# Patient Record
Sex: Female | Born: 1970
Health system: Southern US, Community
[De-identification: ages and names within clinical notes are randomized; demographics above are authoritative.]

## PROBLEM LIST (undated history)

## (undated) DIAGNOSIS — G8929 Other chronic pain: Secondary | ICD-10-CM

## (undated) DIAGNOSIS — Z9289 Personal history of other medical treatment: Secondary | ICD-10-CM

## (undated) DIAGNOSIS — M5432 Sciatica, left side: Secondary | ICD-10-CM

## (undated) DIAGNOSIS — R569 Unspecified convulsions: Secondary | ICD-10-CM

## (undated) DIAGNOSIS — N912 Amenorrhea, unspecified: Principal | ICD-10-CM

## (undated) DIAGNOSIS — R002 Palpitations: Secondary | ICD-10-CM

## (undated) DIAGNOSIS — F329 Major depressive disorder, single episode, unspecified: Secondary | ICD-10-CM

## (undated) DIAGNOSIS — R079 Chest pain, unspecified: Secondary | ICD-10-CM

## (undated) DIAGNOSIS — R519 Headache, unspecified: Secondary | ICD-10-CM

## (undated) DIAGNOSIS — R51 Headache: Secondary | ICD-10-CM

## (undated) DIAGNOSIS — F419 Anxiety disorder, unspecified: Secondary | ICD-10-CM

## (undated) DIAGNOSIS — F32A Depression, unspecified: Secondary | ICD-10-CM

## (undated) HISTORY — PX: DILATION AND CURETTAGE OF UTERUS: SHX78

## (undated) HISTORY — DX: Depression, unspecified: F32.A

## (undated) HISTORY — DX: Amenorrhea, unspecified: N91.2

## (undated) HISTORY — DX: Sciatica, left side: M54.32

## (undated) HISTORY — DX: Anxiety disorder, unspecified: F41.9

## (undated) HISTORY — DX: Major depressive disorder, single episode, unspecified: F32.9

## (undated) HISTORY — DX: Unspecified convulsions: R56.9

## (undated) HISTORY — DX: Headache: R51

## (undated) HISTORY — DX: Headache, unspecified: R51.9

---

## 2008-02-24 ENCOUNTER — Inpatient Hospital Stay (HOSPITAL_COMMUNITY): Admission: AD | Admit: 2008-02-24 | Discharge: 2008-02-24 | Payer: Self-pay | Admitting: Family Medicine

## 2008-05-14 ENCOUNTER — Inpatient Hospital Stay (HOSPITAL_COMMUNITY): Admission: AD | Admit: 2008-05-14 | Discharge: 2008-05-14 | Payer: Self-pay | Admitting: Obstetrics & Gynecology

## 2008-05-14 ENCOUNTER — Ambulatory Visit: Payer: Self-pay | Admitting: Obstetrics and Gynecology

## 2008-05-30 ENCOUNTER — Ambulatory Visit (HOSPITAL_COMMUNITY): Admission: RE | Admit: 2008-05-30 | Discharge: 2008-05-30 | Payer: Self-pay | Admitting: Obstetrics & Gynecology

## 2008-06-27 ENCOUNTER — Ambulatory Visit (HOSPITAL_COMMUNITY): Admission: RE | Admit: 2008-06-27 | Discharge: 2008-06-27 | Payer: Self-pay | Admitting: Obstetrics & Gynecology

## 2008-07-11 ENCOUNTER — Ambulatory Visit (HOSPITAL_COMMUNITY): Admission: RE | Admit: 2008-07-11 | Discharge: 2008-07-11 | Payer: Self-pay | Admitting: Obstetrics & Gynecology

## 2008-10-29 ENCOUNTER — Inpatient Hospital Stay (HOSPITAL_COMMUNITY): Admission: AD | Admit: 2008-10-29 | Discharge: 2008-10-29 | Payer: Self-pay | Admitting: Obstetrics & Gynecology

## 2008-11-14 ENCOUNTER — Inpatient Hospital Stay (HOSPITAL_COMMUNITY): Admission: AD | Admit: 2008-11-14 | Discharge: 2008-11-14 | Payer: Self-pay | Admitting: Obstetrics & Gynecology

## 2008-11-14 ENCOUNTER — Ambulatory Visit: Payer: Self-pay | Admitting: Obstetrics and Gynecology

## 2008-12-12 ENCOUNTER — Ambulatory Visit: Payer: Self-pay | Admitting: Obstetrics and Gynecology

## 2008-12-12 ENCOUNTER — Inpatient Hospital Stay (HOSPITAL_COMMUNITY): Admission: AD | Admit: 2008-12-12 | Discharge: 2008-12-15 | Payer: Self-pay | Admitting: Obstetrics & Gynecology

## 2009-06-04 ENCOUNTER — Emergency Department (HOSPITAL_COMMUNITY): Admission: EM | Admit: 2009-06-04 | Discharge: 2009-06-04 | Payer: Self-pay | Admitting: Family Medicine

## 2010-05-23 LAB — POCT URINALYSIS DIP (DEVICE)
Bilirubin Urine: NEGATIVE
Glucose, UA: NEGATIVE mg/dL
Hgb urine dipstick: NEGATIVE
Ketones, ur: NEGATIVE mg/dL
Nitrite: NEGATIVE
Protein, ur: NEGATIVE mg/dL
Specific Gravity, Urine: 1.015 (ref 1.005–1.030)
Urobilinogen, UA: 2 mg/dL — ABNORMAL HIGH (ref 0.0–1.0)
pH: 7 (ref 5.0–8.0)

## 2010-05-23 LAB — POCT PREGNANCY, URINE: Preg Test, Ur: NEGATIVE

## 2010-06-07 LAB — CBC
HCT: 38.2 % (ref 36.0–46.0)
Hemoglobin: 12.6 g/dL (ref 12.0–15.0)
MCHC: 33 g/dL (ref 30.0–36.0)
MCV: 93.5 fL (ref 78.0–100.0)
Platelets: 146 10*3/uL — ABNORMAL LOW (ref 150–400)
RBC: 4.08 MIL/uL (ref 3.87–5.11)
RDW: 14 % (ref 11.5–15.5)
WBC: 8.7 10*3/uL (ref 4.0–10.5)

## 2010-06-07 LAB — RPR: RPR Ser Ql: NONREACTIVE

## 2010-06-09 LAB — DIFFERENTIAL
Basophils Absolute: 0 10*3/uL (ref 0.0–0.1)
Basophils Relative: 0 % (ref 0–1)
Eosinophils Absolute: 0.1 10*3/uL (ref 0.0–0.7)
Eosinophils Relative: 1 % (ref 0–5)
Lymphocytes Relative: 16 % (ref 12–46)
Lymphs Abs: 1.6 10*3/uL (ref 0.7–4.0)
Monocytes Absolute: 0.9 10*3/uL (ref 0.1–1.0)
Monocytes Relative: 9 % (ref 3–12)
Neutro Abs: 7.3 10*3/uL (ref 1.7–7.7)
Neutrophils Relative %: 74 % (ref 43–77)

## 2010-06-09 LAB — CBC
HCT: 37 % (ref 36.0–46.0)
Hemoglobin: 12.3 g/dL (ref 12.0–15.0)
MCHC: 33.3 g/dL (ref 30.0–36.0)
MCV: 93.3 fL (ref 78.0–100.0)
Platelets: 184 10*3/uL (ref 150–400)
RBC: 3.96 MIL/uL (ref 3.87–5.11)
RDW: 14.1 % (ref 11.5–15.5)
WBC: 10 10*3/uL (ref 4.0–10.5)

## 2010-06-09 LAB — COMPREHENSIVE METABOLIC PANEL
ALT: 60 U/L — ABNORMAL HIGH (ref 0–35)
AST: 45 U/L — ABNORMAL HIGH (ref 0–37)
Albumin: 2.8 g/dL — ABNORMAL LOW (ref 3.5–5.2)
Alkaline Phosphatase: 145 U/L — ABNORMAL HIGH (ref 39–117)
BUN: 8 mg/dL (ref 6–23)
CO2: 23 mEq/L (ref 19–32)
Calcium: 9.4 mg/dL (ref 8.4–10.5)
Chloride: 107 mEq/L (ref 96–112)
Creatinine, Ser: 0.55 mg/dL (ref 0.4–1.2)
GFR calc Af Amer: >60 mL/min (ref >60)
GFR calc non Af Amer: >60 mL/min (ref >60)
Glucose, Bld: 108 mg/dL — ABNORMAL HIGH (ref 70–99)
Potassium: 3.8 mEq/L (ref 3.5–5.1)
Sodium: 136 mEq/L (ref 135–145)
Total Bilirubin: 0.5 mg/dL (ref 0.3–1.2)
Total Protein: 6.4 g/dL (ref 6.0–8.3)

## 2010-06-09 LAB — AMYLASE: Amylase: 105 U/L (ref 27–131)

## 2010-06-14 LAB — URINALYSIS, ROUTINE W REFLEX MICROSCOPIC
Bilirubin Urine: NEGATIVE
Glucose, UA: NEGATIVE mg/dL
Hgb urine dipstick: NEGATIVE
Ketones, ur: NEGATIVE mg/dL
Nitrite: NEGATIVE
Protein, ur: NEGATIVE mg/dL
Specific Gravity, Urine: 1.01 (ref 1.005–1.030)
Urobilinogen, UA: 0.2 mg/dL (ref 0.0–1.0)
pH: 6 (ref 5.0–8.0)

## 2010-06-14 LAB — POCT PREGNANCY, URINE: Preg Test, Ur: POSITIVE

## 2010-09-26 IMAGING — US US OB NUCHAL TRANSLUCENCY 1ST GEST
1 series · 18 of 25 positions shown · non-contrast
Comparison: none

OBSTETRICAL ULTRASOUND:
 This ultrasound was performed in The [HOSPITAL], and the [facility] report will be stored to [REDACTED] PACS.

[Series 1: us ob nuchal translucency 1st gest · 18 of 25 slices shown]
[im 1/25]
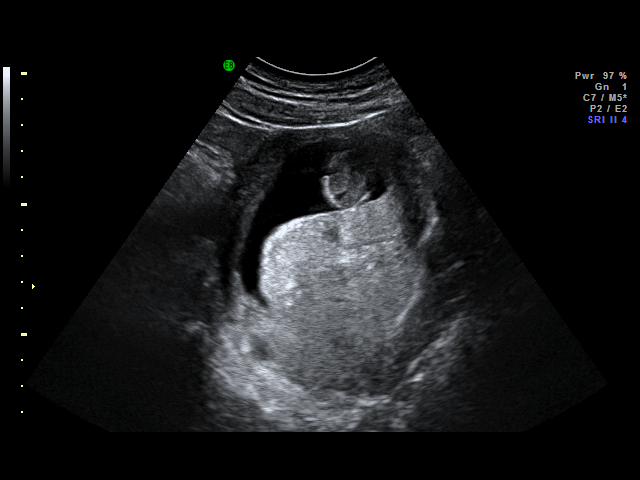
[im 3/25]
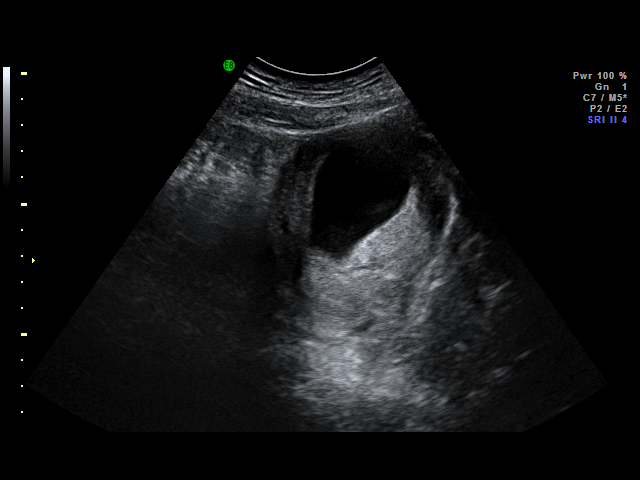
[im 4/25]
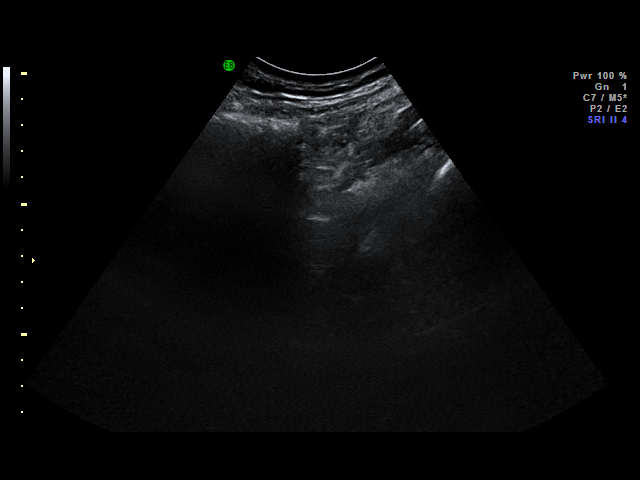
[im 5/25]
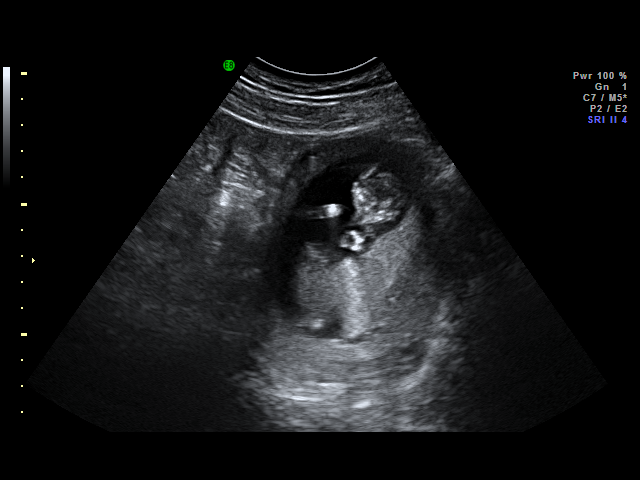
[im 7/25]
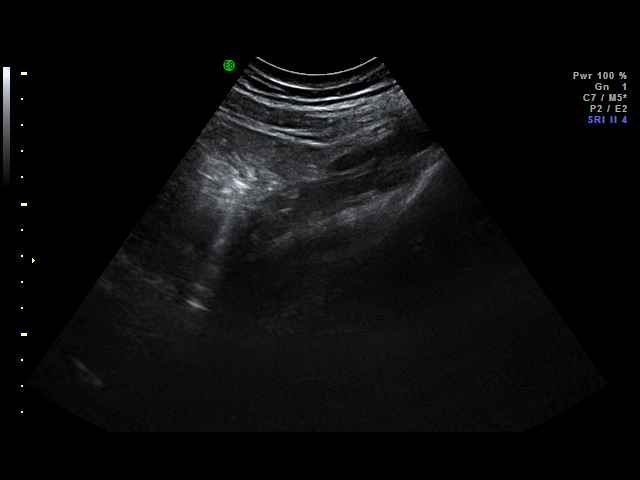
[im 8/25]
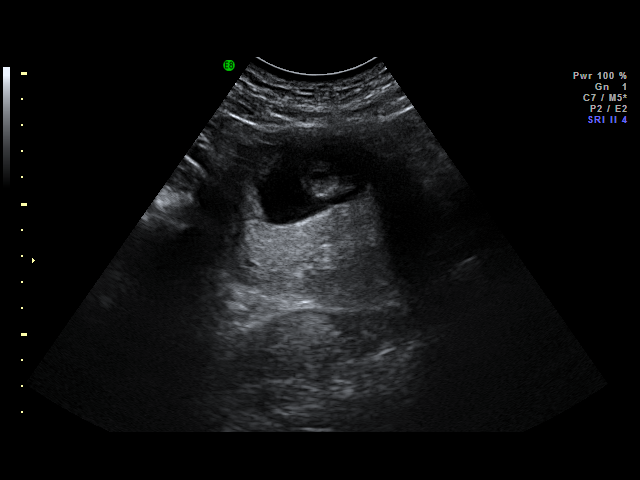
[im 10/25]
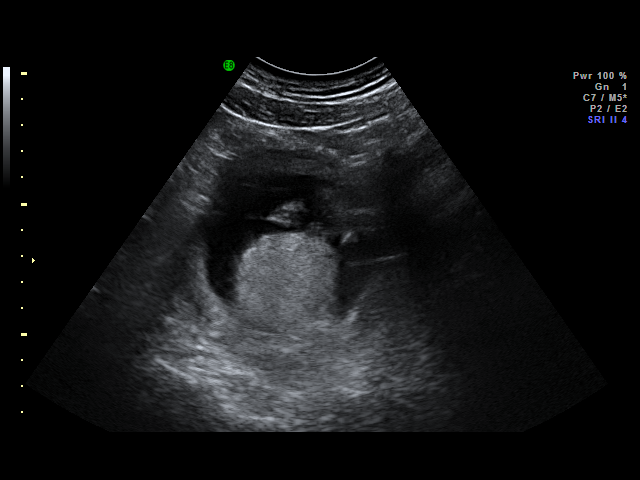
[im 11/25]
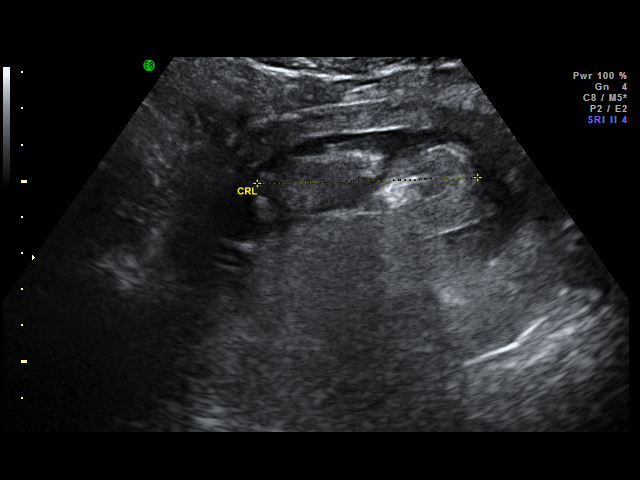
[im 12/25]
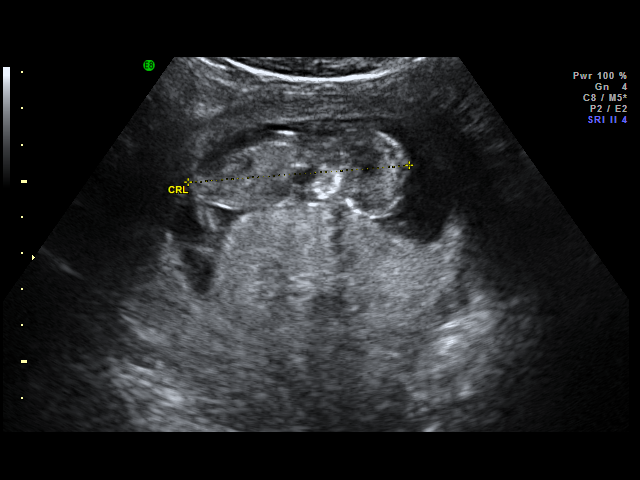
[im 14/25]
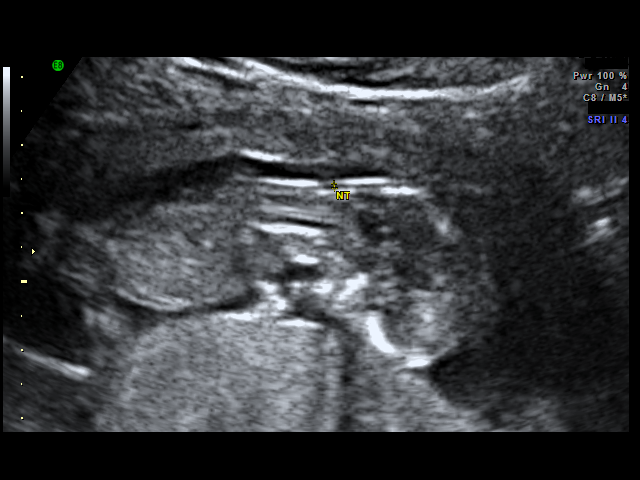
[im 15/25]
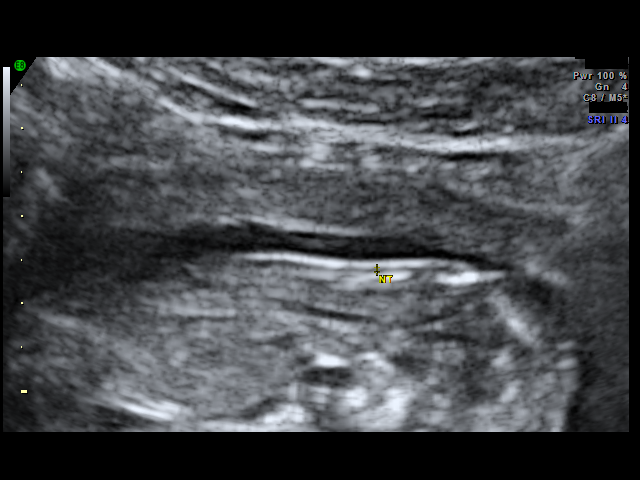
[im 16/25]
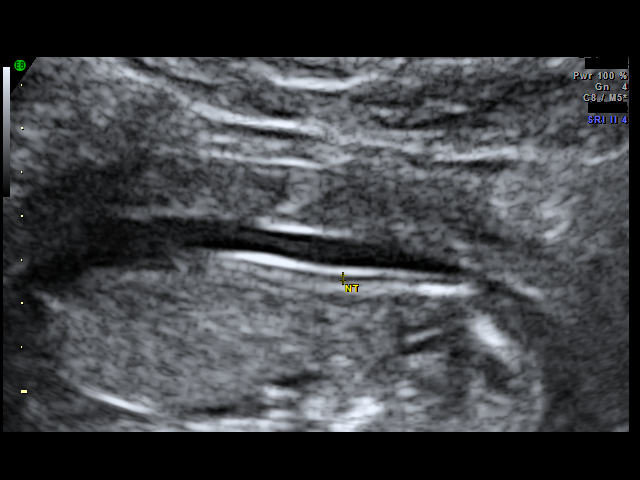
[im 18/25]
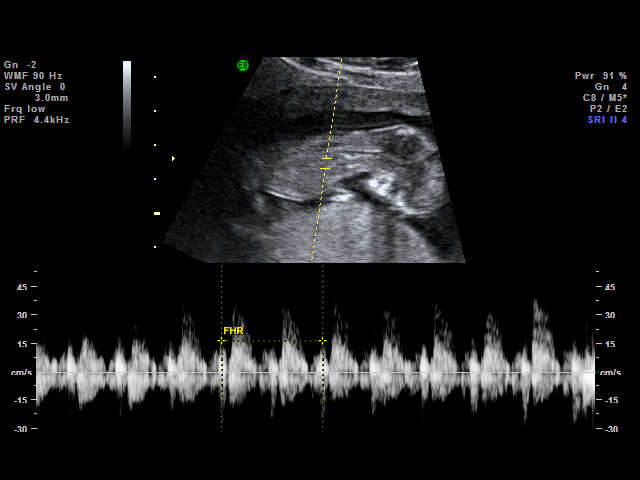
[im 19/25]
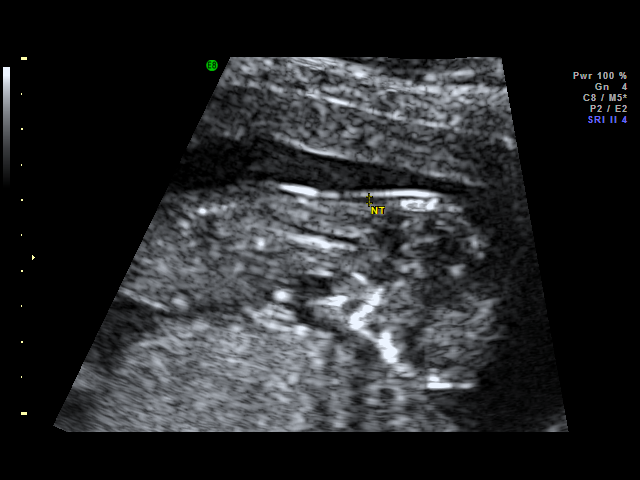
[im 21/25]
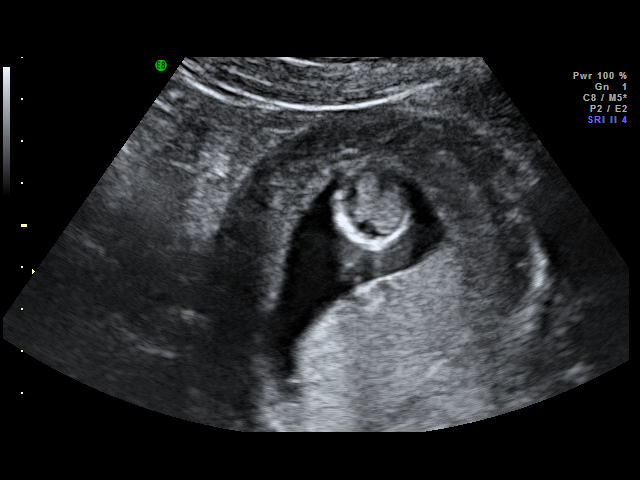
[im 22/25]
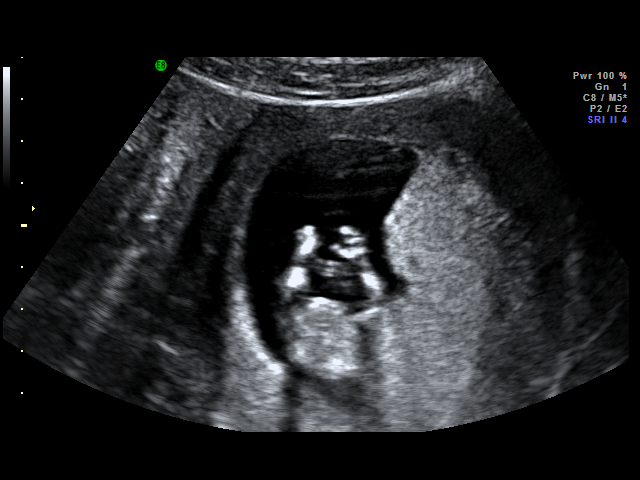
[im 23/25]
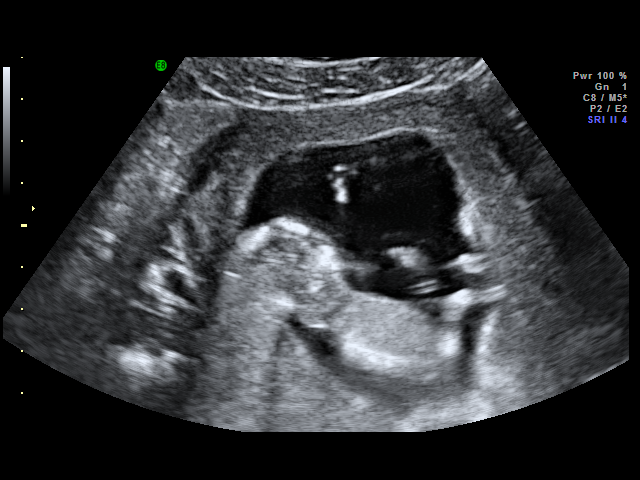
[im 25/25]
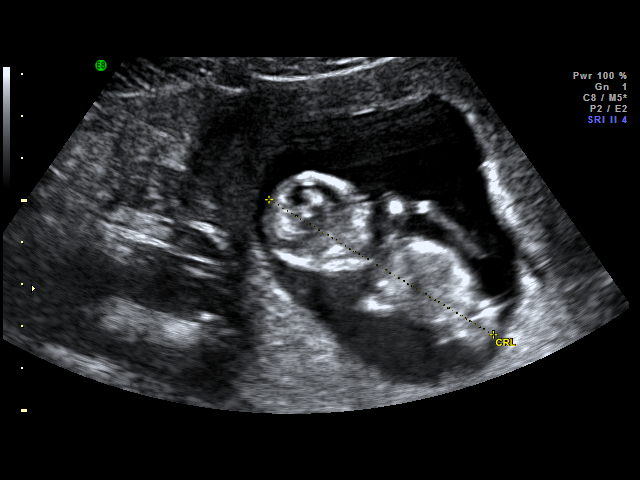

[18 of 25 positions shown; findings below may reference images not displayed]

IMPRESSION: [facility] has also been faxed to the ordering physician.

## 2010-12-07 LAB — URINALYSIS, ROUTINE W REFLEX MICROSCOPIC
Bilirubin Urine: NEGATIVE
Glucose, UA: NEGATIVE mg/dL
Hgb urine dipstick: NEGATIVE
Ketones, ur: NEGATIVE mg/dL
Nitrite: NEGATIVE
Protein, ur: NEGATIVE mg/dL
Specific Gravity, Urine: <1.005 — ABNORMAL LOW (ref 1.005–1.030)
Urobilinogen, UA: 0.2 mg/dL (ref 0.0–1.0)
pH: 6.5 (ref 5.0–8.0)

## 2010-12-07 LAB — POCT PREGNANCY, URINE: Preg Test, Ur: NEGATIVE

## 2010-12-07 LAB — WET PREP, GENITAL
Clue Cells Wet Prep HPF POC: NONE SEEN
Trich, Wet Prep: NONE SEEN
Yeast Wet Prep HPF POC: NONE SEEN

## 2010-12-07 LAB — GC/CHLAMYDIA PROBE AMP, GENITAL
Chlamydia, DNA Probe: NEGATIVE
GC Probe Amp, Genital: NEGATIVE

## 2012-01-14 ENCOUNTER — Encounter (HOSPITAL_COMMUNITY): Payer: Self-pay | Admitting: *Deleted

## 2012-01-14 ENCOUNTER — Emergency Department (HOSPITAL_COMMUNITY): Payer: Self-pay

## 2012-01-14 ENCOUNTER — Emergency Department (HOSPITAL_COMMUNITY)
Admission: EM | Admit: 2012-01-14 | Discharge: 2012-01-14 | Disposition: A | Discharge status: Home / Self Care | Payer: Self-pay | Attending: Emergency Medicine | Admitting: Emergency Medicine

## 2012-01-14 DIAGNOSIS — Z3202 Encounter for pregnancy test, result negative: Secondary | ICD-10-CM | POA: Insufficient documentation

## 2012-01-14 DIAGNOSIS — R0789 Other chest pain: Secondary | ICD-10-CM | POA: Insufficient documentation

## 2012-01-14 LAB — BASIC METABOLIC PANEL
BUN: 16 mg/dL (ref 6–23)
CO2: 29 mEq/L (ref 19–32)
Calcium: 9.5 mg/dL (ref 8.4–10.5)
Chloride: 101 mEq/L (ref 96–112)
Creatinine, Ser: 0.68 mg/dL (ref 0.50–1.10)
GFR calc Af Amer: >90 mL/min (ref >90)
GFR calc non Af Amer: >90 mL/min (ref >90)
Glucose, Bld: 105 mg/dL — ABNORMAL HIGH (ref 70–99)
Potassium: 4.5 mEq/L (ref 3.5–5.1)
Sodium: 138 mEq/L (ref 135–145)

## 2012-01-14 LAB — POCT I-STAT TROPONIN I: Troponin i, poc: 0 ng/mL (ref 0.00–0.08)

## 2012-01-14 LAB — CBC WITH DIFFERENTIAL/PLATELET
Basophils Absolute: 0 10*3/uL (ref 0.0–0.1)
Basophils Relative: 0 % (ref 0–1)
Eosinophils Absolute: 0.1 10*3/uL (ref 0.0–0.7)
Eosinophils Relative: 2 % (ref 0–5)
HCT: 40.1 % (ref 36.0–46.0)
Hemoglobin: 13.4 g/dL (ref 12.0–15.0)
Lymphocytes Relative: 48 % — ABNORMAL HIGH (ref 12–46)
Lymphs Abs: 3.3 10*3/uL (ref 0.7–4.0)
MCH: 30.2 pg (ref 26.0–34.0)
MCHC: 33.4 g/dL (ref 30.0–36.0)
MCV: 90.3 fL (ref 78.0–100.0)
Monocytes Absolute: 0.4 10*3/uL (ref 0.1–1.0)
Monocytes Relative: 6 % (ref 3–12)
Neutro Abs: 3 10*3/uL (ref 1.7–7.7)
Neutrophils Relative %: 44 % (ref 43–77)
Platelets: 211 10*3/uL (ref 150–400)
RBC: 4.44 MIL/uL (ref 3.87–5.11)
RDW: 11.8 % (ref 11.5–15.5)
WBC: 6.9 10*3/uL (ref 4.0–10.5)

## 2012-01-14 LAB — URINALYSIS, ROUTINE W REFLEX MICROSCOPIC
Bilirubin Urine: NEGATIVE
Glucose, UA: NEGATIVE mg/dL
Ketones, ur: NEGATIVE mg/dL
Leukocytes, UA: NEGATIVE
Nitrite: NEGATIVE
Protein, ur: NEGATIVE mg/dL
Specific Gravity, Urine: 1.012 (ref 1.005–1.030)
Urobilinogen, UA: 1 mg/dL (ref 0.0–1.0)
pH: 6.5 (ref 5.0–8.0)

## 2012-01-14 LAB — URINE MICROSCOPIC-ADD ON

## 2012-01-14 LAB — POCT PREGNANCY, URINE: Preg Test, Ur: NEGATIVE

## 2012-01-14 LAB — D-DIMER, QUANTITATIVE: D-Dimer, Quant: 0.6 ug/mL-FEU — ABNORMAL HIGH (ref 0.00–0.48)

## 2012-01-14 MED ORDER — FAMOTIDINE 20 MG PO TABS
20.0000 mg | ORAL_TABLET | Freq: Once | ORAL | Status: AC
  Administered 2012-01-14: 20 mg via ORAL
  Filled 2012-01-14: qty 1

## 2012-01-14 MED ORDER — KETOROLAC TROMETHAMINE 30 MG/ML IJ SOLN
30.0000 mg | Freq: Once | INTRAMUSCULAR | Status: AC
  Administered 2012-01-14: 30 mg via INTRAVENOUS
  Filled 2012-01-14: qty 1

## 2012-01-14 MED ORDER — IOHEXOL 350 MG/ML SOLN
100.0000 mL | Freq: Once | INTRAVENOUS | Status: AC | PRN
  Administered 2012-01-14: 100 mL via INTRAVENOUS

## 2012-01-14 NOTE — ED Provider Notes (Signed)
History     CSN: [id number]  Arrival date & time 01/14/12  4098   First MD Initiated Contact with Patient 01/14/12 2043      Chief Complaint  Patient presents with  . Chest Pain    (Consider location/radiation/quality/duration/timing/severity/associated sxs/prior treatment) HPI Comments: Patient presents with complaint of intermittent midsternal chest pain X 2 months. Patient states that the pain at its worst is 7/10 with some transmission to her back. She states that she saw her physician recently for the same complaint and was told all tests were normal. She sought a second opinion that told her it may be anxiety. Patient states that her mother has cardiomyopathy, but otherwise no family cardiac history. Denies fever or chills. Denies SOB. Denies palpitation or lower extremity edema. Denies cough or congestion. Denies calf pain, history of blood clot, cancer, recent surgery, smoking, exogenous estrogen. She reports a long car ride to [place] 3 months ago.  The history is provided by the patient. No language interpreter was used.    History reviewed. No pertinent past medical history.  History reviewed. No pertinent past surgical history.  No family history on file.  History  Substance Use Topics  . Smoking status: Never Smoker   . Smokeless tobacco: Never Used  . Alcohol Use: No    OB History    Grav Para Term Preterm Abortions TAB SAB Ect Mult Living                  Review of Systems  Constitutional: Negative for fever and chills.  HENT: Negative for congestion.   Respiratory: Negative for cough and shortness of breath.   Cardiovascular: Positive for chest pain. Negative for palpitations and leg swelling.  Gastrointestinal: Negative for nausea.    Allergies  Review of patient's allergies indicates no known allergies.  Home Medications   Current Outpatient Rx  Name  Route  Sig  Dispense  Refill  . ADULT MULTIVITAMIN W/MINERALS CH   Oral   Take 1 tablet by mouth  daily.         Marland Kitchen OVER THE COUNTER MEDICATION   Oral   Take 1 tablet by mouth daily as needed. For stress           BP 109/81  Pulse 60  Temp 98.3 F (36.8 C)  Resp 13  SpO2 100%  LMP 12/03/2011  Physical Exam  Nursing note and vitals reviewed. Constitutional: She appears well-developed and well-nourished.  HENT:  Head: Normocephalic and atraumatic.  Mouth/Throat: Oropharynx is clear and moist.  Eyes: Conjunctivae normal and EOM are normal. No scleral icterus.  Neck: Normal range of motion. Neck supple.  Cardiovascular: Normal rate, regular rhythm, normal heart sounds and intact distal pulses.  Exam reveals no gallop and no friction rub.   No murmur heard. Pulmonary/Chest: Effort normal and breath sounds normal. She exhibits tenderness.       Chest pain is reproducible on exam with palpation of the sternum.   Abdominal: Soft. Bowel sounds are normal. There is no tenderness.  Lymphadenopathy:    She has no cervical adenopathy.  Neurological: She is alert.  Skin: Skin is warm and dry.    ED Course  Procedures (including critical care time)  Labs Reviewed  CBC WITH DIFFERENTIAL - Abnormal; Notable for the following:    Lymphocytes Relative 48 (*)     All other components within normal limits  BASIC METABOLIC PANEL - Abnormal; Notable for the following:    Glucose, Bld 105 (*)  All other components within normal limits  URINALYSIS, ROUTINE W REFLEX MICROSCOPIC - Abnormal; Notable for the following:    Hgb urine dipstick TRACE (*)     All other components within normal limits  D-DIMER, QUANTITATIVE - Abnormal; Notable for the following:    D-Dimer, Quant 0.60 (*)     All other components within normal limits  POCT PREGNANCY, URINE  URINE MICROSCOPIC-ADD ON  POCT I-STAT TROPONIN I   Results for orders placed during the hospital encounter of 01/14/12  CBC WITH DIFFERENTIAL      Component Value Range   WBC 6.9  4.0 - 10.5 K/uL   RBC 4.44  3.87 - 5.11 MIL/uL    Hemoglobin 13.4  12.0 - 15.0 g/dL   HCT 16.1  09.6 - 04.5 %   MCV 90.3  78.0 - 100.0 fL   MCH 30.2  26.0 - 34.0 pg   MCHC 33.4  30.0 - 36.0 g/dL   RDW 40.9  81.1 - 91.4 %   Platelets 211  150 - 400 K/uL   Neutrophils Relative 44  43 - 77 %   Neutro Abs 3.0  1.7 - 7.7 K/uL   Lymphocytes Relative 48 (*) 12 - 46 %   Lymphs Abs 3.3  0.7 - 4.0 K/uL   Monocytes Relative 6  3 - 12 %   Monocytes Absolute 0.4  0.1 - 1.0 K/uL   Eosinophils Relative 2  0 - 5 %   Eosinophils Absolute 0.1  0.0 - 0.7 K/uL   Basophils Relative 0  0 - 1 %   Basophils Absolute 0.0  0.0 - 0.1 K/uL  BASIC METABOLIC PANEL      Component Value Range   Sodium 138  135 - 145 mEq/L   Potassium 4.5  3.5 - 5.1 mEq/L   Chloride 101  96 - 112 mEq/L   CO2 29  19 - 32 mEq/L   Glucose, Bld 105 (*) 70 - 99 mg/dL   BUN 16  6 - 23 mg/dL   Creatinine, Ser 7.82  0.50 - 1.10 mg/dL   Calcium 9.5  8.4 - 95.6 mg/dL   GFR calc non Af Amer >90  >90 mL/min   GFR calc Af Amer >90  >90 mL/min  URINALYSIS, ROUTINE W REFLEX MICROSCOPIC      Component Value Range   Color, Urine YELLOW  YELLOW   APPearance CLEAR  CLEAR   Specific Gravity, Urine 1.012  1.005 - 1.030   pH 6.5  5.0 - 8.0   Glucose, UA NEGATIVE  NEGATIVE mg/dL   Hgb urine dipstick TRACE (*) NEGATIVE   Bilirubin Urine NEGATIVE  NEGATIVE   Ketones, ur NEGATIVE  NEGATIVE mg/dL   Protein, ur NEGATIVE  NEGATIVE mg/dL   Urobilinogen, UA 1.0  0.0 - 1.0 mg/dL   Nitrite NEGATIVE  NEGATIVE   Leukocytes, UA NEGATIVE  NEGATIVE  POCT PREGNANCY, URINE      Component Value Range   Preg Test, Ur NEGATIVE  NEGATIVE  URINE MICROSCOPIC-ADD ON      Component Value Range   Squamous Epithelial / LPF RARE  RARE   RBC / HPF 0-2  <3 RBC/hpf  D-DIMER, QUANTITATIVE      Component Value Range   D-Dimer, Quant 0.60 (*) 0.00 - 0.48 ug/mL-FEU  POCT I-STAT TROPONIN I      Component Value Range   Troponin i, poc 0.00  0.00 - 0.08 ng/mL   Comment 3  Dg Chest 2 View  01/14/2012   *RADIOLOGY REPORT*  Clinical Data:  Chest pain.  CHEST - 2 VIEW  Comparison: None  Findings: The heart size and mediastinal contours are within normal limits.  Both lungs are clear.  The visualized skeletal structures are unremarkable.  IMPRESSION: No active disease.   Original Report Authenticated By: Irish Lack, M.D.    Ct Angio Chest Pe W/cm &/or Wo Cm  01/14/2012  *RADIOLOGY REPORT*  Clinical Data: Elevated D-dimer.  Chest pain.  Shortness of breath.  CT ANGIOGRAPHY CHEST  Technique:  Multidetector CT imaging of the chest using the standard protocol during bolus administration of intravenous contrast. Multiplanar reconstructed images including MIPs were obtained and reviewed to evaluate the vascular anatomy.  Contrast: OMNIPAQUE IOHEXOL 350 MG/ML SOLN  Comparison: 01/14/2012 chest x-ray.  Findings: No filling defects in the pulmonary arteries to suggest pulmonary emboli. Heart is normal size. Aorta is normal caliber. No mediastinal, hilar, or axillary adenopathy.  Visualized thyroid and chest wall soft tissues unremarkable.  Lungs are clear.  No focal airspace opacities or suspicious nodules.  No effusions. Imaging into the upper abdomen shows no acute findings.  IMPRESSION: No acute findings.  No evidence of pulmonary embolus.   Original Report Authenticated By: Charlett Nose, M.D.      1. Chest pain, non-cardiac     Date: 01/15/2012  Rate: 87  Rhythm: normal sinus rhythm  QRS Axis: normal  Intervals: normal  ST/T Wave abnormalities: normal  Conduction Disutrbances:none  Narrative Interpretation: NO STEMI  Old EKG Reviewed: none available     MDM  Patient presented with CP for 2 months. EKG and CXR: unremarkable. Troponin: negative. D-dimer: elevated. CT angio: unremarkable. Patient pain improved with Toradol. Low likelihood of cardiac or pulmonary etiology. Patient discharged with return precautions. Patient encouraged to follow-up with PCP for anxiety recommendations. No red  flags for AMI, PE, PNA, or DVT.        Pixie Casino, PA-C 01/15/12 0017

## 2012-01-14 NOTE — ED Notes (Signed)
Pt is here with midsternal chest pain for the last 2 months and increases with deep breath.  Three months ago drove to [place].  SOB.

## 2012-01-14 NOTE — ED Notes (Signed)
Patient transported to and from radiology. 

## 2012-01-15 NOTE — ED Provider Notes (Signed)
Medical screening examination/treatment/procedure(s) were performed by non-physician practitioner and as supervising physician I was immediately available for consultation/collaboration.  Leona Pressly K Linker, MD 01/15/12 1706 

## 2012-06-26 ENCOUNTER — Ambulatory Visit: Payer: 59 | Admitting: Emergency Medicine

## 2012-06-26 VITALS — BP 120/82 | HR 72 | Temp 98.0°F | Resp 18 | Ht 69.5 in | Wt 155.0 lb

## 2012-06-26 DIAGNOSIS — L040 Acute lymphadenitis of face, head and neck: Secondary | ICD-10-CM

## 2012-06-26 DIAGNOSIS — L049 Acute lymphadenitis, unspecified: Secondary | ICD-10-CM

## 2012-06-26 MED ORDER — PENICILLIN V POTASSIUM 500 MG PO TABS: 500.0000 mg | ORAL_TABLET | Freq: Four times a day (QID) | ORAL | Status: DC

## 2012-06-26 NOTE — Progress Notes (Signed)
[facility] [address] [place] [address] [phone]  Date:  06/26/2012   Name:  Katie Chaney   DOB:  27-Aug-1970   MRN:  [id number]  PCP:  No PCP Per Patient    Chief Complaint: Sore Throat   History of Present Illness:  Katie Chaney is a 42 y.o. very pleasant female patient who presents with the following:  Sore throat for three days.  Located on right side and in nodes in right neck.  No fever or chills. No coryza or cough.  Some dysphagia.  No nausea or vomiting.  No stool change or rash.  Has history of recurrent visits for palpitations and evaluated in ER for chest pain.  Still having palpitations and requests a visit for evaluation with cardiology.  There is no problem list on file for this patient.   Past Medical History  Diagnosis Date  . Anxiety   . Seizures     No past surgical history on file.  History  Substance Use Topics  . Smoking status: Never Smoker   . Smokeless tobacco: Never Used  . Alcohol Use: Yes    Family History  Problem Relation Age of Onset  . Heart disease Mother   . Heart disease Brother   . Asthma Son     No Known Allergies  Medication list has been reviewed and updated.  Current Outpatient Prescriptions on File Prior to Visit  Medication Sig Dispense Refill  . Multiple Vitamin (MULTIVITAMIN WITH MINERALS) TABS Take 1 tablet by mouth daily.      Marland Kitchen OVER THE COUNTER MEDICATION Take 1 tablet by mouth daily as needed. For stress       No current facility-administered medications on file prior to visit.    Review of Systems:  As per HPI, otherwise negative.    Physical Examination: Filed Vitals:   06/26/12 1231  BP: 120/82  Pulse: 72  Temp: 98 F (36.7 C)  Resp: 18   Filed Vitals:   06/26/12 1231  Height: 5' 9.5" (1.765 m)  Weight: 155 lb (70.308 kg)   Body mass index is 22.57 kg/(m^2). Ideal Body Weight: Weight in (lb) to have BMI = 25: 171.4  GEN: WDWN, NAD, Non-toxic, A & O x  3 HEENT: Atraumatic, Normocephalic. Neck supple. No masses, right cervical lymphadenopathy.   Ears and Nose: No external deformity. CV: RRR, No M/G/R. No JVD. No thrill. No extra heart sounds. PULM: CTA B, no wheezes, crackles, rhonchi. No retractions. No resp. distress. No accessory muscle use. ABD: S, NT, ND, +BS. No rebound. No HSM. EXTR: No c/c/e NEURO Normal gait.  PSYCH: Normally interactive. Conversant. Not depressed or anxious appearing.  Calm demeanor.    Assessment and Plan: Cervical lymphadenitis Pen vk Palpitations and chest pain Cardiology consultation   Signed,  Phillips Odor, MD

## 2012-06-26 NOTE — Progress Notes (Signed)
Reviewed and agree.

## 2012-06-26 NOTE — Patient Instructions (Addendum)
Linfadenopatía °(Lymphadenopathy) °Linfadenopatía significa "enfermedad de los ganglios linfáticos". Pero el término se utiliza para describir la hinchazón o dilatación de las glándulas linfáticas, también llamadas ganglios linfáticos. Son órganos con forma de frijol que se encuentran en muchos lugares, entre ellos el cuello, las axilas y la ingle. Las glándulas linfáticas son parte del sistema inmunológico, que lucha contra las infecciones en su cuerpo. La linfadenopatía puede ocurrir sólo en una zona de su cuerpo, como el cuello, o puede estar generalizada y presentar un agrandamiento de los ganglios en varios lugares. Los ganglios del cuello son los que más comúnmente presentan el trastorno. °CAUSAS  °Cuando su sistema inmunológico responde a los gérmenes (como virus o bacterias), se producen células y líquidos para luchar contra la infección. Esto hace que las glándulas aumenten su tamaño. Esto por lo general es normal y no debe preocuparse. En ocasiones, las mismas glándulas pueden infectarse e hincharse. Esto se denomina linfadenitis. °El agrandamiento de los ganglios linfáticos puede estar causado por muchas enfermedades. °· Enfermedades bacterianas, como faringitis estreptocócica o una infección de la piel. °· Enfermedades virales, como el resfrío común. °· Otros gérmenes, como la enfermedad de Lyme, tuberculosis, o enfermedades de transmisión sexual. °· Ciertos tipos de cáncer, como linfoma (cáncer del sistema linfático) o leucemia (cáncer de los glóbulos blancos). °· Enfermedades inflamatorias como el lupus o la artritis reumatoide. °· Reacciones alérgicas a medicamentos. °Muchas de las enfermedades mencionadas arriba son poco frecuentes, pero importantes. Es por esto que debe ver al profesional que lo asiste si tiene linfadenopatía. °SÍNTOMAS °· Bultos inflamados en el cuello, la parte posterior de la cabeza u otros lugares. °· Sensibilidad. °· Calor o enrojecimiento de la piel sobre los ganglios  linfáticos. °· Fiebre. °DIAGNÓSTICO °A menudo el agrandamiento de los ganglios linfáticos ocurre cerca del origen de la infección. Esto puede ayudar a los médicos a diagnosticar su enfermedad. Para ejemplo:  °· La presencia de ganglios linfáticos inflamados cerca de la mandíbula puede ser resultado de una infección en la boca. °· Ganglios inflamados en el cuello son a menudo una señal de una infección en la garganta. °· Si existen ganglios inflamados en más de una zona es probable que exista una enfermedad causada por un virus. °El profesional muy probablemente sabrá qué es lo que causa su linfadenopatía luego de conocer su historial y de examinarlo. Pueden ser necesarios análisis de sangre, radiografías y otras pruebas. Si no se puede encontrar la causa de la inflamación de los ganglios, y ésta no se va por sí misma, entonces puede ser necesaria una biopsia. El profesional lo comentará con usted. °TRATAMIENTO °El tratamiento dependerá de la causa que provoca la inflamación. Muchas veces los ganglios volverán a su tamaño normal, sin necesidad de un tratamiento. Es posible que deba utilizar antibióticos u otros medicamentos para tratar una infección. Sólo tome medicamentos de venta libre o los que le prescriba su médico para aliviar el dolor, el malestar o la fiebre, según las indicaciones. °INSTRUCCIONES PARA EL CUIDADO DOMICILIARIO °Las glándulas linfáticas inflamadas volverán a su tamaño normal cuando el trastorno subyacente que causa la inflamación se haya curado. Si la inflamación persiste, consulte con el profesional que lo asiste. Es posible que le prescriba antibióticos u otro tratamiento, esto depende del diagnóstico. Utilice los medicamentos tal como se le indicó. Vaya a las citas de seguimiento que se hayan programado para controlar el estado de sus ganglios.  °SOLICITE ATENCIÓN MÉDICA SI: °· La inflamación dura más de dos semanas. °· Tiene síntomas como pérdida de   peso, transpira por la noche, fatiga o  fiebre persistente.  Los ganglios se sienten duros, parecen fijos en la piel o crecen con rapidez.  La piel sobre los ganglios linfticos se ve roja e inflamada. Esto puede ser indicio de una infeccin. SOLICITE ATENCIN MDICA DE INMEDIATO SI:  Comienza a filtrarse lquido de la zona del ganglio inflamado.  La temperatura se eleva por encima de 102 F (38.9 C).  Siente dolor fuerte (no necesariamente en la zona del ganglio inflamado).  Siente falta de aire o Journalist, newspaper.  Presenta dolor abdominal que empeora. ASEGRESE DE QUE:   Comprende estas instrucciones.  Controlar el trastorno.  Pedir ayuda enseguida si no se recupera adecuadamente o si empeora. Document Released: 05/17/2008 Document Revised: 05/13/2011 [facility] Patient Information 2013 [place], [place].

## 2012-07-02 DIAGNOSIS — Z9289 Personal history of other medical treatment: Secondary | ICD-10-CM

## 2012-07-02 HISTORY — DX: Personal history of other medical treatment: Z92.89

## 2012-07-22 ENCOUNTER — Encounter: Payer: Self-pay | Admitting: Cardiology

## 2012-07-22 ENCOUNTER — Encounter: Payer: Self-pay | Admitting: *Deleted

## 2012-07-22 DIAGNOSIS — R569 Unspecified convulsions: Secondary | ICD-10-CM | POA: Insufficient documentation

## 2012-07-22 DIAGNOSIS — F419 Anxiety disorder, unspecified: Secondary | ICD-10-CM | POA: Insufficient documentation

## 2012-07-22 DIAGNOSIS — F418 Other specified anxiety disorders: Secondary | ICD-10-CM | POA: Insufficient documentation

## 2012-07-22 DIAGNOSIS — R079 Chest pain, unspecified: Secondary | ICD-10-CM | POA: Insufficient documentation

## 2012-07-23 ENCOUNTER — Ambulatory Visit (INDEPENDENT_AMBULATORY_CARE_PROVIDER_SITE_OTHER): Payer: 59 | Admitting: Cardiology

## 2012-07-23 ENCOUNTER — Encounter: Payer: Self-pay | Admitting: Cardiology

## 2012-07-23 VITALS — BP 116/72 | HR 69 | Ht 69.5 in | Wt 155.0 lb

## 2012-07-23 DIAGNOSIS — R002 Palpitations: Secondary | ICD-10-CM

## 2012-07-23 DIAGNOSIS — R079 Chest pain, unspecified: Secondary | ICD-10-CM

## 2012-07-23 NOTE — Assessment & Plan Note (Signed)
Will arrange a CardioNet to further evaluate.

## 2012-07-23 NOTE — Assessment & Plan Note (Addendum)
Symptoms atypical and may be musculoskeletal. Given dyspnea will schedule echocardiogram to assess LV function.

## 2012-07-23 NOTE — Progress Notes (Signed)
  HPI: 42 year old female for evaluation of chest pain and palpitations. Patient states she has had intermittent palpitations for approximately one and one half years. They are described as a skip and a flutter. They occur 3 times weekly. No syncope. She has dyspnea on exertion but no orthopnea, PND or pedal edema. She has occasional pain radiating to her left neck and chest. It can last all day and is relieved with aspirin. It increases with inspiration. Because of the above cardiology asked to evaluate.  Current Outpatient Prescriptions  Medication Sig Dispense Refill  . Multiple Vitamin (MULTIVITAMIN WITH MINERALS) TABS Take 1 tablet by mouth daily.      Marland Kitchen OVER THE COUNTER MEDICATION Take 1 tablet by mouth daily as needed. For stress (one a day women's for stress)       No current facility-administered medications for this visit.    No Known Allergies  Past Medical History  Diagnosis Date  . Anxiety   . Seizures     History reviewed. No pertinent past surgical history.  History   Social History  . Marital Status: Married    Spouse Name: N/A    Number of Children: 1  . Years of Education: N/A   Occupational History  .     Social History Main Topics  . Smoking status: Never Smoker   . Smokeless tobacco: Never Used  . Alcohol Use: Yes     Comment: Rarely  . Drug Use: No  . Sexually Active: Not on file   Other Topics Concern  . Not on file   Social History Narrative  . No narrative on file    Family History  Problem Relation Age of Onset  . Heart disease Mother     Enlarged heart  . Hypertension Brother   . Asthma Son     ROS: no fevers or chills, productive cough, hemoptysis, dysphasia, odynophagia, melena, hematochezia, dysuria, hematuria, rash, seizure activity, orthopnea, PND, pedal edema, claudication. Remaining systems are negative.  Physical Exam:   Blood pressure 116/72, pulse 69, height 5' 9.5" (1.765 m), weight 155 lb (70.308 kg), last menstrual  period 06/27/2011.  General:  Well developed/well nourished in NAD Skin warm/dry Patient not depressed No peripheral clubbing Back-normal HEENT-normal/normal eyelids Neck supple/normal carotid upstroke bilaterally; no bruits; no JVD; no thyromegaly chest - CTA/ normal expansion CV - RRR/normal S1 and S2; no murmurs, rubs or gallops;  PMI nondisplaced Abdomen -NT/ND, no HSM, no mass, + bowel sounds, no bruit 2+ femoral pulses, no bruits Ext-no edema, chords, 2+ DP Neuro-grossly nonfocal  ECG sinus rhythm at a rate of 69. No ST changes. No delta wave.

## 2012-07-23 NOTE — Patient Instructions (Addendum)
Your physician recommends that you schedule a follow-up appointment in: 8 WEEKS WITH DR Jens Som  Your physician has recommended that you wear an event monitor. Event monitors are medical devices that record the heart's electrical activity. Doctors most often [place] these monitors to diagnose arrhythmias. Arrhythmias are problems with the speed or rhythm of the heartbeat. The monitor is a small, portable device. You can wear one while you do your normal daily activities. This is usually used to diagnose what is causing palpitations/syncope (passing out).   Your physician has requested that you have an echocardiogram. Echocardiography is a painless test that uses sound waves to create images of your heart. It provides your doctor with information about the size and shape of your heart and how well your heart's chambers and valves are working. This procedure takes approximately one hour. There are no restrictions for this procedure.

## 2012-07-30 ENCOUNTER — Encounter: Payer: Self-pay | Admitting: *Deleted

## 2012-07-31 ENCOUNTER — Telehealth: Payer: Self-pay | Admitting: Cardiology

## 2012-07-31 ENCOUNTER — Encounter: Payer: Self-pay | Admitting: *Deleted

## 2012-07-31 ENCOUNTER — Ambulatory Visit (HOSPITAL_COMMUNITY): Payer: 59 | Attending: Cardiology | Admitting: Radiology

## 2012-07-31 ENCOUNTER — Encounter (INDEPENDENT_AMBULATORY_CARE_PROVIDER_SITE_OTHER): Payer: 59

## 2012-07-31 DIAGNOSIS — R002 Palpitations: Secondary | ICD-10-CM

## 2012-07-31 DIAGNOSIS — R0989 Other specified symptoms and signs involving the circulatory and respiratory systems: Secondary | ICD-10-CM | POA: Insufficient documentation

## 2012-07-31 DIAGNOSIS — R0609 Other forms of dyspnea: Secondary | ICD-10-CM | POA: Insufficient documentation

## 2012-07-31 DIAGNOSIS — R079 Chest pain, unspecified: Secondary | ICD-10-CM | POA: Insufficient documentation

## 2012-07-31 DIAGNOSIS — R072 Precordial pain: Secondary | ICD-10-CM

## 2012-07-31 NOTE — Progress Notes (Signed)
Echocardiogram performed.  

## 2012-07-31 NOTE — Telephone Encounter (Signed)
New problem ° ° °Per pt returning your call °

## 2012-07-31 NOTE — Progress Notes (Signed)
Patient ID: Katie Chaney, female   DOB: [birth year], 42 y.o.   MRN: [id number] [employer] 30 day event monitor applied to patient.

## 2012-07-31 NOTE — Telephone Encounter (Signed)
pt aware of results of echo

## 2012-08-03 ENCOUNTER — Encounter: Payer: Self-pay | Admitting: *Deleted

## 2012-08-04 ENCOUNTER — Encounter: Payer: 59 | Admitting: Adult Health

## 2012-08-10 ENCOUNTER — Ambulatory Visit (INDEPENDENT_AMBULATORY_CARE_PROVIDER_SITE_OTHER): Payer: 59 | Admitting: Adult Health

## 2012-08-10 ENCOUNTER — Encounter: Payer: Self-pay | Admitting: Adult Health

## 2012-08-10 VITALS — BP 112/70 | Ht 68.0 in | Wt 159.0 lb

## 2012-08-10 DIAGNOSIS — Z3202 Encounter for pregnancy test, result negative: Secondary | ICD-10-CM

## 2012-08-10 DIAGNOSIS — N912 Amenorrhea, unspecified: Secondary | ICD-10-CM

## 2012-08-10 HISTORY — DX: Amenorrhea, unspecified: N91.2

## 2012-08-10 LAB — POCT URINE PREGNANCY: Preg Test, Ur: NEGATIVE

## 2012-08-10 NOTE — Patient Instructions (Addendum)
Follow-up in 2 weeks

## 2012-08-10 NOTE — Progress Notes (Signed)
Subjective:     Patient ID: Katie Chaney, female   DOB: [birth year], 42 y.o.   MRN: [id number]  HPI Katie Chaney is a 42 year old black female in complaining of no menses in 1 year, she was seen at clinic.  Review of Systems Patient denies any headaches, blurred vision, shortness of breath, chest pain, abdominal pain, problems with bowel movements, urination, or intercourse. No joint swelling or mood swings, she does have hot flashes, and would like 1 more child, she has 41 41 year old.NO menses x 1 year.  Reviewed past medical,surgical, social and family history. Reviewed medications and allergies.     Objective:   Physical Exam BP 112/70  Ht 5\' 8"  (1.727 m)  Wt 159 lb (72.122 kg)  BMI 24.18 kg/m2  LMP 06/09/2013Urine pregnancy test negative.Abd: soft non tender, No HSM     Assessment:      Amenorrhea    Plan:    Check TSH, and FSH  Return in 2 weeks to discuss options Call in am for test results, will try to get notes from clinic

## 2012-08-11 LAB — TSH: TSH: 1.274 u[IU]/mL (ref 0.350–4.500)

## 2012-08-11 LAB — FOLLICLE STIMULATING HORMONE: FSH: 134.6 m[IU]/mL — ABNORMAL HIGH

## 2012-08-13 ENCOUNTER — Telehealth: Payer: Self-pay | Admitting: Adult Health

## 2012-08-13 NOTE — Telephone Encounter (Signed)
Pt aware of labs keep follow up appt.

## 2012-08-25 ENCOUNTER — Encounter: Payer: Self-pay | Admitting: *Deleted

## 2012-08-25 ENCOUNTER — Ambulatory Visit: Payer: 59 | Admitting: Adult Health

## 2012-09-01 DIAGNOSIS — R002 Palpitations: Secondary | ICD-10-CM

## 2012-09-01 HISTORY — DX: Palpitations: R00.2

## 2012-09-03 ENCOUNTER — Telehealth: Payer: Self-pay | Admitting: *Deleted

## 2012-09-03 NOTE — Telephone Encounter (Signed)
Spoke with pt, aware monitor reviewed by dr Jens Som shows sinus rhythm with rare PAC.

## 2012-09-25 ENCOUNTER — Telehealth: Payer: Self-pay | Admitting: Adult Health

## 2012-09-25 NOTE — Telephone Encounter (Signed)
I called pt in follow up, she missed 6/24 appt and had not rescheduled, so after my call she wants to make appt for 10/09/12.She now has appt at 12:30 pm with Dr Despina Hidden, to discuss labs and ?premature ovarian failure and treatment options.

## 2012-09-28 ENCOUNTER — Ambulatory Visit: Payer: 59 | Admitting: Cardiology

## 2012-10-09 ENCOUNTER — Ambulatory Visit (INDEPENDENT_AMBULATORY_CARE_PROVIDER_SITE_OTHER): Payer: 59 | Admitting: Obstetrics & Gynecology

## 2012-10-09 ENCOUNTER — Encounter: Payer: Self-pay | Admitting: Obstetrics & Gynecology

## 2012-10-09 VITALS — BP 100/60 | Ht 68.0 in | Wt 162.0 lb

## 2012-10-09 DIAGNOSIS — E28319 Asymptomatic premature menopause: Secondary | ICD-10-CM | POA: Insufficient documentation

## 2012-10-09 MED ORDER — NORETHIN ACE-ETH ESTRAD-FE 1-20 MG-MCG(24) PO TABS: 1.0000 | ORAL_TABLET | Freq: Every day | ORAL | Status: DC

## 2012-10-09 NOTE — Progress Notes (Signed)
Patient ID: Katie Chaney, female   DOB: 06/25/1970, 42 y.o.   MRN: [id number] Charlese is in for evaluation for early menopause(premature ovarian failure is reserve for women under the age of 41 years old) She has not had a period in over one year and her [facility] is certainly in the menopausal range However she understands that this doesn't always main forever and ever on an because the ovaries can sputter for quite some time  However to address the issues of estrogenic deprivation we have decided to place her on a low-dose birth control pill which will also serve the purpose as birth control  All placed her on low estrogen and see her back in 3 months to see how she's doing

## 2012-10-09 NOTE — Patient Instructions (Signed)

## 2012-10-14 ENCOUNTER — Telehealth: Payer: Self-pay | Admitting: Obstetrics & Gynecology

## 2012-10-14 NOTE — Telephone Encounter (Signed)
Spoke with pt. Dr. Despina Hidden put her on birth control pills. She hasn't started med because she was reading side effects and it said it can cause depression, anxiety, and seizures. Pt already has all these. Can you prescribe something else? Thanks!!!

## 2012-10-14 NOTE — Telephone Encounter (Signed)
Spoke with pt letting her know she is on a low dose OCP and that is what the dr wants her to take. Pt voiced understanding. JSY

## 2012-12-01 ENCOUNTER — Encounter: Payer: 59 | Admitting: Cardiology

## 2012-12-01 NOTE — Progress Notes (Signed)
   HPI: fu palpitations. Echocardiogram in May of 2014 showed normal LV function. CardioNet in May of 2014 showed sinus rhythm with occasional PAC. TSH in June of 2014 1.274. Since she was last seen    Current Outpatient Prescriptions  Medication Sig Dispense Refill  . amoxicillin (AMOXIL) 500 MG capsule Take 500 mg by mouth 3 (three) times daily.      . Multiple Vitamin (MULTIVITAMIN WITH MINERALS) TABS Take 1 tablet by mouth daily.      . Norethindrone Acetate-Ethinyl Estrad-FE (LOESTRIN 24 FE) 1-20 MG-MCG(24) tablet Take 1 tablet by mouth daily.  1 Package  11  . OVER THE COUNTER MEDICATION Take 1 tablet by mouth daily as needed. For stress (one a day women's for stress)       No current facility-administered medications for this visit.     Past Medical History  Diagnosis Date  . Anxiety   . Seizures   . Headache(784.0)   . Depression   . Amenorrhea 08/10/2012    Past Surgical History  Procedure Laterality Date  . Dilation and curettage of uterus      History   Social History  . Marital Status: Married    Spouse Name: N/A    Number of Children: 1  . Years of Education: N/A   Occupational History  .     Social History Main Topics  . Smoking status: Never Smoker   . Smokeless tobacco: Never Used  . Alcohol Use: Yes     Comment: Rarely  . Drug Use: No  . Sexual Activity: Yes    Birth Control/ Protection: None   Other Topics Concern  . Not on file   Social History Narrative  . No narrative on file    ROS: no fevers or chills, productive cough, hemoptysis, dysphasia, odynophagia, melena, hematochezia, dysuria, hematuria, rash, seizure activity, orthopnea, PND, pedal edema, claudication. Remaining systems are negative.  Physical Exam: Well-developed well-nourished in no acute distress.  Skin is warm and dry.  HEENT is normal.  Neck is supple.  Chest is clear to auscultation with normal expansion.  Cardiovascular exam is regular rate and rhythm.  Abdominal  exam nontender or distended. No masses palpated. Extremities show no edema. neuro grossly intact  ECG     This encounter was created in error - please disregard.

## 2013-01-11 ENCOUNTER — Ambulatory Visit: Payer: 59 | Admitting: Obstetrics & Gynecology

## 2013-01-11 ENCOUNTER — Encounter: Payer: Self-pay | Admitting: *Deleted

## 2013-08-02 ENCOUNTER — Emergency Department (INDEPENDENT_AMBULATORY_CARE_PROVIDER_SITE_OTHER): Payer: Self-pay

## 2013-08-02 ENCOUNTER — Encounter (HOSPITAL_COMMUNITY): Payer: Self-pay | Admitting: Emergency Medicine

## 2013-08-02 ENCOUNTER — Emergency Department (INDEPENDENT_AMBULATORY_CARE_PROVIDER_SITE_OTHER)
Admission: EM | Admit: 2013-08-02 | Discharge: 2013-08-02 | Disposition: A | Discharge status: Home / Self Care | Payer: Self-pay | Source: Home / Self Care | Attending: Emergency Medicine | Admitting: Emergency Medicine

## 2013-08-02 DIAGNOSIS — M545 Low back pain, unspecified: Secondary | ICD-10-CM

## 2013-08-02 DIAGNOSIS — M5430 Sciatica, unspecified side: Secondary | ICD-10-CM

## 2013-08-02 MED ORDER — MELOXICAM 15 MG PO TABS: 15.0000 mg | ORAL_TABLET | Freq: Every day | ORAL | Status: DC

## 2013-08-02 MED ORDER — CYCLOBENZAPRINE HCL 10 MG PO TABS: 10.0000 mg | ORAL_TABLET | Freq: Three times a day (TID) | ORAL | Status: DC | PRN

## 2013-08-02 NOTE — Discharge Instructions (Signed)
Back Exercises °Back exercises help treat and prevent back injuries. The goal of back exercises is to increase the strength of your abdominal and back muscles and the flexibility of your back. These exercises should be started when you no longer have back pain. Back exercises include: °· Pelvic Tilt. Lie on your back with your knees bent. Tilt your pelvis until the lower part of your back is against the floor. Hold this position 5 to 10 sec and repeat 5 to 10 times. °· Knee to Chest. Pull first 1 knee up against your chest and hold for 20 to 30 seconds, repeat this with the other knee, and then both knees. This may be done with the other leg straight or bent, whichever feels better. °· Sit-Ups or Curl-Ups. Bend your knees 90 degrees. Start with tilting your pelvis, and do a partial, slow sit-up, lifting your trunk only 30 to 45 degrees off the floor. Take at least 2 to 3 seconds for each sit-up. Do not do sit-ups with your knees out straight. If partial sit-ups are difficult, simply do the above but with only tightening your abdominal muscles and holding it as directed. °· Hip-Lift. Lie on your back with your knees flexed 90 degrees. Push down with your feet and shoulders as you raise your hips a couple inches off the floor; hold for 10 seconds, repeat 5 to 10 times. °· Back arches. Lie on your stomach, propping yourself up on bent elbows. Slowly press on your hands, causing an arch in your low back. Repeat 3 to 5 times. Any initial stiffness and discomfort should lessen with repetition over time. °· Shoulder-Lifts. Lie face down with arms beside your body. Keep hips and torso pressed to floor as you slowly lift your head and shoulders off the floor. °Do not overdo your exercises, especially in the beginning. Exercises may cause you some mild back discomfort which lasts for a few minutes; however, if the pain is more severe, or lasts for more than 15 minutes, do not continue exercises until you see your caregiver.  Improvement with exercise therapy for back problems is slow.  °See your caregivers for assistance with developing a proper back exercise program. °Document Released: 03/28/2004 Document Revised: 05/13/2011 Document Reviewed: 12/20/2010 °[employer]® Patient Information ©2014 [employer]. ° °Back Pain, Adult °Low back pain is very common. About 1 in 5 people have back pain. The cause of low back pain is rarely dangerous. The pain often gets better over time. About half of people with a sudden onset of back pain feel better in just 2 weeks. About 8 in 10 people feel better by 6 weeks.  °CAUSES °Some common causes of back pain include: °· Strain of the muscles or ligaments supporting the spine. °· Wear and tear (degeneration) of the spinal discs. °· Arthritis. °· Direct injury to the back. °DIAGNOSIS °Most of the time, the direct cause of low back pain is not known. However, back pain can be treated effectively even when the exact cause of the pain is unknown. Answering your caregiver's questions about your overall health and symptoms is one of the most accurate ways to make sure the cause of your pain is not dangerous. If your caregiver needs more information, he or she may order lab work or imaging tests (X-rays or MRIs). However, even if imaging tests show changes in your back, this usually does not require surgery. °HOME CARE INSTRUCTIONS °For many people, back pain returns. Since low back pain is rarely dangerous, it is often a condition that people   can learn to manage on their own.  °· Remain active. It is stressful on the back to sit or stand in one place. Do not sit, drive, or stand in one place for more than 30 minutes at a time. Take short walks on level surfaces as soon as pain allows. Try to increase the length of time you walk each day. °· Do not stay in bed. Resting more than 1 or 2 days can delay your recovery. °· Do not avoid exercise or work. Your body is made to move. It is not dangerous to be active,  even though your back may hurt. Your back will likely heal faster if you return to being active before your pain is gone. °· Pay attention to your body when you  bend and lift. Many people have less discomfort when lifting if they bend their knees, keep the load close to their bodies, and avoid twisting. Often, the most comfortable positions are those that put less stress on your recovering back. °· Find a comfortable position to sleep. Use a firm mattress and lie on your side with your knees slightly bent. If you lie on your back, put a pillow under your knees. °· Only take over-the-counter or prescription medicines as directed by your caregiver. Over-the-counter medicines to reduce pain and inflammation are often the most helpful. Your caregiver may prescribe muscle relaxant drugs. These medicines help dull your pain so you can more quickly return to your normal activities and healthy exercise. °· Put ice on the injured area. °· Put ice in a plastic bag. °· Place a towel between your skin and the bag. °· Leave the ice on for 15-20 minutes, 03-04 times a day for the first 2 to 3 days. After that, ice and heat may be alternated to reduce pain and spasms. °· Ask your caregiver about trying back exercises and gentle massage. This may be of some benefit. °· Avoid feeling anxious or stressed. Stress increases muscle tension and can worsen back pain. It is important to recognize when you are anxious or stressed and learn ways to manage it. Exercise is a great option. °SEEK MEDICAL CARE IF: °· You have pain that is not relieved with rest or medicine. °· You have pain that does not improve in 1 week. °· You have new symptoms. °· You are generally not feeling well. °SEEK IMMEDIATE MEDICAL CARE IF:  °· You have pain that radiates from your back into your legs. °· You develop new bowel or bladder control problems. °· You have unusual weakness or numbness in your arms or legs. °· You develop nausea or vomiting. °· You develop  abdominal pain. °· You feel faint. °Document Released: 02/18/2005 Document Revised: 08/20/2011 Document Reviewed: 07/09/2010 °[employer]® Patient Information ©2014 [employer]. ° °

## 2013-08-02 NOTE — ED Provider Notes (Signed)
CSN: [id number]     Arrival date & time 08/02/13  1222 History   First MD Initiated Contact with Patient 08/02/13 1354     Chief Complaint  Patient presents with  . Back Pain   (Consider location/radiation/quality/duration/timing/severity/associated sxs/prior Treatment) HPI Comments: 43 year old female presents complaining of low back pain and sciatica. This started 8 days ago after she lifted a heavy box. She was picking up and felt a pulling sensation in her lower back. She had significant pain the next day. She has tried icing, heating pad, soaking in Epsom salts, but none of this has helped. The pain radiates up her back in the middle, and also down her left leg. She denies any weakness in her legs. She denies loss of bowel or bladder control.  Patient is a 43 y.o. female presenting with back pain.  Back Pain Associated symptoms: no numbness and no weakness     Past Medical History  Diagnosis Date  . Anxiety   . Seizures   . Headache(784.0)   . Depression   . Amenorrhea 08/10/2012   Past Surgical History  Procedure Laterality Date  . Dilation and curettage of uterus     Family History  Problem Relation Age of Onset  . Heart disease Mother     Enlarged heart  . Hypertension Brother   . Asthma Son    History  Substance Use Topics  . Smoking status: Never Smoker   . Smokeless tobacco: Never Used  . Alcohol Use: Yes     Comment: Rarely   OB History   Grav Para Term Preterm Abortions TAB SAB Ect Mult Living   3 1   2  2   1      Review of Systems  Musculoskeletal: Positive for back pain and myalgias.  Skin: Negative for rash.  Neurological: Negative for weakness and numbness.  All other systems reviewed and are negative.   Allergies  Review of patient's allergies indicates no known allergies.  Home Medications   Prior to Admission medications   Medication Sig Start Date End Date Taking? Authorizing Provider  amoxicillin (AMOXIL) 500 MG capsule Take 500 mg by  mouth 3 (three) times daily.    Historical Provider, MD  cyclobenzaprine (FLEXERIL) 10 MG tablet Take 1 tablet (10 mg total) by mouth 3 (three) times daily as needed for muscle spasms. 08/02/13   Graylon Good, PA-C  meloxicam (MOBIC) 15 MG tablet Take 1 tablet (15 mg total) by mouth daily. 08/02/13   Graylon Good, PA-C  Multiple Vitamin (MULTIVITAMIN WITH MINERALS) TABS Take 1 tablet by mouth daily.    Historical Provider, MD  Norethindrone Acetate-Ethinyl Estrad-FE (LOESTRIN 24 FE) 1-20 MG-MCG(24) tablet Take 1 tablet by mouth daily. 10/09/12   Lazaro Arms, MD  OVER THE COUNTER MEDICATION Take 1 tablet by mouth daily as needed. For stress (one a day women's for stress)    Historical Provider, MD   BP 127/79  Pulse 69  Temp(Src) 98.2 F (36.8 C) (Oral)  Resp 16  SpO2 100% Physical Exam  Nursing note and vitals reviewed. Constitutional: She is oriented to person, place, and time. Vital signs are normal. She appears well-developed and well-nourished. No distress.  HENT:  Head: Normocephalic and atraumatic.  Pulmonary/Chest: Effort normal. No respiratory distress.  Musculoskeletal:       Lumbar back: She exhibits tenderness, bony tenderness (diffuse, no point tenderness) and pain. She exhibits normal range of motion, no swelling, no edema, no deformity and no spasm.  Straight leg and cross straight leg tests are normal/negative  Neurological: She is alert and oriented to person, place, and time. She has normal strength and normal reflexes. No sensory deficit. Coordination and gait normal.  Skin: Skin is warm and dry. No rash noted. She is not diaphoretic.  Psychiatric: She has a normal mood and affect. Judgment normal.    ED Course  Procedures (including critical care time) Labs Review Labs Reviewed - No data to display  Imaging Review Dg Lumbar Spine Complete  08/02/2013   CLINICAL DATA:  Low back pain  EXAM: LUMBAR SPINE - COMPLETE 4+ VIEW  COMPARISON:  June 04, 2009  FINDINGS:  Frontal, lateral, spot lumbosacral lateral, and bilateral oblique views were obtained. There are 5 non-rib-bearing lumbar type vertebral bodies. There is no fracture or spondylolisthesis. Disk spaces appear intact. There is no appreciable facet arthropathy.  IMPRESSION: No fracture or appreciable arthropathy.   Electronically Signed   By: Bretta BangWilliam  Woodruff M.D.   On: 08/02/2013 14:26     MDM   1. Lumbago   2. Sciatica    Treated with meloxicam and Flexeril. Referred to physical therapy.   Meds ordered this encounter  Medications  . meloxicam (MOBIC) 15 MG tablet    Sig: Take 1 tablet (15 mg total) by mouth daily.    Dispense:  30 tablet    Refill:  0    Order Specific Question:  Supervising Provider    Answer:  Lorenz CoasterKELLER, DAVID C [id number]  . cyclobenzaprine (FLEXERIL) 10 MG tablet    Sig: Take 1 tablet (10 mg total) by mouth 3 (three) times daily as needed for muscle spasms.    Dispense:  20 tablet    Refill:  0    Order Specific Question:  Supervising Provider    Answer:  Lorenz CoasterKELLER, DAVID C [[id number]]       Graylon GoodZachary H Day Deery, PA-C 08/02/13 1451

## 2013-08-02 NOTE — ED Notes (Signed)
Pt c/o lower back pain onset 8 days Reports she lifted a box that weighed 15-25 pounds Reports she felt a pull; taking ibup and placing ice w/no relief Pain increases w/acitivty Denies urinary sx, abd pain, f/v/n/d Alert w/no signs of acute distress.

## 2013-08-03 NOTE — ED Provider Notes (Signed)
Medical screening examination/treatment/procedure(s) were performed by resident physician or non-physician practitioner and as supervising physician I was immediately available for consultation/collaboration.   Barkley Bruns MD.   Linna Hoff, MD 08/03/13 [phone]

## 2013-12-17 ENCOUNTER — Emergency Department (HOSPITAL_COMMUNITY)
Admission: EM | Admit: 2013-12-17 | Discharge: 2013-12-17 | Disposition: A | Discharge status: Home / Self Care | Payer: BC Managed Care – PPO | Attending: Emergency Medicine | Admitting: Emergency Medicine

## 2013-12-17 ENCOUNTER — Encounter (HOSPITAL_COMMUNITY): Payer: Self-pay | Admitting: Emergency Medicine

## 2013-12-17 ENCOUNTER — Emergency Department (HOSPITAL_COMMUNITY): Payer: BC Managed Care – PPO

## 2013-12-17 DIAGNOSIS — R0789 Other chest pain: Secondary | ICD-10-CM

## 2013-12-17 DIAGNOSIS — Z792 Long term (current) use of antibiotics: Secondary | ICD-10-CM | POA: Diagnosis not present

## 2013-12-17 DIAGNOSIS — R519 Headache, unspecified: Secondary | ICD-10-CM

## 2013-12-17 DIAGNOSIS — Z79818 Long term (current) use of other agents affecting estrogen receptors and estrogen levels: Secondary | ICD-10-CM | POA: Diagnosis not present

## 2013-12-17 DIAGNOSIS — Z791 Long term (current) use of non-steroidal anti-inflammatories (NSAID): Secondary | ICD-10-CM | POA: Diagnosis not present

## 2013-12-17 DIAGNOSIS — R079 Chest pain, unspecified: Secondary | ICD-10-CM | POA: Diagnosis present

## 2013-12-17 DIAGNOSIS — Z8742 Personal history of other diseases of the female genital tract: Secondary | ICD-10-CM | POA: Diagnosis not present

## 2013-12-17 DIAGNOSIS — F419 Anxiety disorder, unspecified: Secondary | ICD-10-CM | POA: Insufficient documentation

## 2013-12-17 DIAGNOSIS — R51 Headache: Secondary | ICD-10-CM | POA: Insufficient documentation

## 2013-12-17 DIAGNOSIS — Z79899 Other long term (current) drug therapy: Secondary | ICD-10-CM | POA: Diagnosis not present

## 2013-12-17 DIAGNOSIS — G8929 Other chronic pain: Secondary | ICD-10-CM

## 2013-12-17 HISTORY — DX: Personal history of other medical treatment: Z92.89

## 2013-12-17 HISTORY — DX: Chest pain, unspecified: R07.9

## 2013-12-17 HISTORY — DX: Palpitations: R00.2

## 2013-12-17 HISTORY — DX: Other chronic pain: G89.29

## 2013-12-17 LAB — I-STAT CHEM 8, ED
BUN: 15 mg/dL (ref 6–23)
Calcium, Ion: 1.25 mmol/L — ABNORMAL HIGH (ref 1.12–1.23)
Chloride: 104 mEq/L (ref 96–112)
Creatinine, Ser: 0.9 mg/dL (ref 0.50–1.10)
Glucose, Bld: 92 mg/dL (ref 70–99)
HCT: 41 % (ref 36.0–46.0)
Hemoglobin: 13.9 g/dL (ref 12.0–15.0)
Potassium: 3.8 mEq/L (ref 3.7–5.3)
Sodium: 140 mEq/L (ref 137–147)
TCO2: 27 mmol/L (ref 0–100)

## 2013-12-17 LAB — I-STAT TROPONIN, ED: Troponin i, poc: 0.01 ng/mL (ref 0.00–0.08)

## 2013-12-17 MED ORDER — METHOCARBAMOL 500 MG PO TABS: 1000.0000 mg | ORAL_TABLET | Freq: Four times a day (QID) | ORAL | Status: DC | PRN

## 2013-12-17 MED ORDER — HYDROCODONE-ACETAMINOPHEN 5-325 MG PO TABS
2.0000 | ORAL_TABLET | Freq: Once | ORAL | Status: AC
  Administered 2013-12-17: 2 via ORAL
  Filled 2013-12-17: qty 2

## 2013-12-17 MED ORDER — HYDROCODONE-ACETAMINOPHEN 5-325 MG PO TABS: ORAL_TABLET | ORAL | Status: DC

## 2013-12-17 MED ORDER — NAPROXEN 250 MG PO TABS: 250.0000 mg | ORAL_TABLET | Freq: Two times a day (BID) | ORAL | Status: DC | PRN

## 2013-12-17 NOTE — Discharge Instructions (Signed)
°Emergency Department Resource Guide °1) Find a Doctor and Pay Out of Pocket °Although you won't have to find out who is covered by your insurance plan, it is a good idea to ask around and get recommendations. You will then need to call the office and see if the doctor you have chosen will accept you as a new patient and what types of options they offer for patients who are self-pay. Some doctors offer discounts or will set up payment plans for their patients who do not have insurance, but you will need to ask so you aren't surprised when you get to your appointment. ° °2) Contact Your Local Health Department °Not all health departments have doctors that can see patients for sick visits, but many do, so it is worth a call to see if yours does. If you don't know where your local health department is, you can check in your phone book. The CDC also has a tool to help you locate your state's health department, and many state websites also have listings of all of their local health departments. ° °3) Find a Walk-in Clinic °If your illness is not likely to be very severe or complicated, you may want to try a walk in clinic. These are popping up all over the country in pharmacies, drugstores, and shopping centers. They're usually staffed by nurse practitioners or physician assistants that have been trained to treat common illnesses and complaints. They're usually fairly quick and inexpensive. However, if you have serious medical issues or chronic medical problems, these are probably not your best option. ° °No Primary Care Doctor: °- Call Health Connect at  832-8000 - they can help you locate a primary care doctor that  accepts your insurance, provides certain services, etc. °- Physician Referral Service- 1-800-533-3463 ° °Chronic Pain Problems: °Organization         Address  Phone   Notes  °[facility]  (336) 297-2271 Patients need to be referred by their primary care doctor.  ° °Medication  Assistance: °Organization         Address  Phone   Notes  °[place] [facility] [address] °[place], [place] [address] (336) 641-8030 --Must be a resident of [place] °-- Must have NO insurance coverage whatsoever (no Medicaid/ Medicare, etc.) °-- The pt. MUST have a primary care doctor that directs their care regularly and follows them in the community °  °[facility]  (866) 331-1348   °[employer]  (888) 892-1162   ° °Agencies that provide inexpensive medical care: °Organization         Address  Phone   Notes  °[facility]  (336) 832-8035   °[facility]    (336) 832-7272   °[facility] [address] °[place], [place] [address] (336) 832-4777   °[facility] [address], °[place] (336) 271-4999   °[facility]    (336) 373-0678   °[facility]    (336) 272-1050   °[facility] ° [address], [place] Phone:  (336) 832-4444, Fax:  (336) 832-4440 Hours of Operation:  9 am - 6 pm, M-F.  Also accepts Medicaid/Medicare and self-pay.  °[facility] ° [address], [place] Phone: (336) 832-3150, Fax: (336) [phone]. Hours of Operation:  8:30 am - 5:30 pm, M-F.  Also accepts Medicaid and self-pay.  °[facility] [place] [address], [place] Phone: (336) 878-6027   °[facility] [address], [place], [place] (336)723-1848, Ext. 123 Mondays & Thursdays: 7-9 AM.  First 15 patients are seen on a first come, first serve basis. °  ° °Medicaid-accepting [place] Providers: ° °Organization         Address  Phone   Notes  °[facility] [address], [place] (336) 641-2100 Also accepts self-pay patients.  °[facility] [address], [place] ° (336) 856-9996   °[facility] [address], [place]  (336) 288-8857   °[facility] [address] [place] [address], [place] (336) 299-7000   °Veita Bland [address], [place]  ° (336) 373-1557 Only accepts [place] Access Medicaid patients after they have their name applied to their card.  ° °Self-Pay (no insurance) in [place]: ° °Organization         Address  Phone   Notes  °Sickle Cell Patients, [facility] [address], [place] (336) 832-1970   °[facility] [address], [place] (336) 832-4400   °[facility] ° [address], [place] (336) 992-4800   °[facility]/Dr. Osei-Bonsu ° [address] [place] [address], [place] or [address], [place] (336) 841-8500 Phone number for both [place] and [place] locations is the same.  °[facility] [address], [place] (336) 299-0000   °[facility] [address] [place] [address], [place] or [address] (336) 852-7530 °(336) 878-2260   °[facility] [address], [place] (336) 350-1642, phone; (336) 294-5005, fax Sees patients 1st and 3rd Saturday of every month.  Must not qualify for public or private insurance (i.e. Medicaid, Medicare, [place] Health Choice, Veterans' Benefits) • Household income should be no more than 200% of the poverty level •The clinic cannot treat you if you are pregnant or think you are pregnant • Sexually transmitted diseases are not treated at the clinic.  ° ° °Dental Care: °Organization         Address  Phone  Notes  °[place] [facility] [address], [place] (336) 641-6152 Accepts children up to age 21 who are enrolled in Medicaid or [place] Health Choice; pregnant women with a Medicaid card; and children who have applied for Medicaid or [place] Health Choice, but were declined, whose parents can pay a reduced fee at time of service.  °[place]  [facility] [place]  [address], [place] (336) 641-7733 Accepts children up to age 21 who are enrolled in Medicaid or [place] Health Choice; pregnant women with a Medicaid card; and children who have applied for Medicaid or [place] Health Choice, but were declined, whose parents can pay a reduced fee at time of service.  °[facility] ° [address], [place] (336) 641-4533 Patients are seen by appointment only. Walk-ins are not accepted. [facility] will see patients 18 years of age and older. °Monday - Tuesday (8am-5pm) °Most Wednesdays (8:30-5pm) °$30 per visit, cash only  °[facility] ° [address], [place] (336) 641-4533 Patients are seen by appointment only. Walk-ins are not accepted. [facility] will see patients 18 years of age and older. °One   Wednesday Evening (Monthly: Volunteer Based).  $30 per visit, cash only  °[facility]  (919) 537-3737 for adults; Children under age 4, call Graduate Pediatric Dentistry at ([phone]) [phone]. Children aged 4-14, please call ([phone]) [phone] to request a pediatric application. ° Dental services are provided in all areas of dental care including fillings, crowns and bridges, complete and partial dentures, implants, gum treatment, root canals, and extractions. Preventive care is also provided. Treatment is provided to both adults and children. °Patients are selected via a lottery and there is often a waiting list. °  °[facility] [address], °[place] ° (336) 763-8833 [email or web address] °  °[facility] [address], [place], [place] (336)723-1848, Ext. 123 Second and Fourth Thursday of each month, opens at 6:30 AM; Clinic ends at 9 AM.  Patients are seen on a first-come first-served basis, and a limited number are seen during each clinic.  ° °[facility] ° [address], [place], [place] (336) 723-7904    Eligibility Requirements °You must have lived in [place], [place], or [place] counties for at least the last three months. °  You cannot be eligible for state or federal sponsored healthcare insurance, including Veterans Administration, Medicaid, or Medicare. °  You generally cannot be eligible for healthcare insurance through your employer.  °  How to apply: °Eligibility screenings are held every Tuesday and Wednesday afternoon from 1:00 pm until 4:00 pm. You do not need an appointment for the interview!  °[facility] [address], [place], [place] 336-631-2330   °[facility]  336-342-8273   °[facility]  336-703-3100   °[facility]  336-570-6415   ° °Behavioral Health Resources in the Community: °Intensive Outpatient Programs °Organization         Address  Phone  Notes  °[facility] [address], [place], [place] 336-878-6098   °[facility] [address], [place], [place] 336-832-9800   °[facility] [address], [place], [place] ° 336-882-2125   °[facility] [address],  °[place], [place] 1-800-853-5163 or 336-641-4981   °Substance Abuse Resources °Organization         Address  Phone  Notes  °[facility]  336-882-2125   °[facility]  336-784-9470   °[facility]  336-285-9073   °[facility]  336-845-3988   °Residential & Outpatient Substance Abuse Program  1-800-659-3381   °Psychological Services °Organization         Address  Phone  Notes  °[facility]  336- 832-9600   °[facility]  336- 378-7881   °[facility] [address], [place] 1-800-853-5163 or 336-641-4981   ° °Mobile Crisis Teams °Organization         Address  Phone  Notes  °[facility], Mobile Crisis Care Unit  1-877-626-1772   °Assertive °[facility] ° [address]  [place], [place] 336-834-9664   °Sharon DeEsch [address] °[place] 336-554-5454   ° °Self-Help/Support Groups °Organization         Address  Phone             Notes  °[facility]  - variety of support groups  336- 373-1402 Call for more information  °Narcotics Anonymous (NA), [facility] [address], °[place]  2 meetings at this location  ° °  Residential Treatment Programs Organization         Address  Phone  Notes  [facility] [address],    [place]  [phone]   [facility]  [address], [place] [phone], [place], [place] [phone]   [facility] [address], [place] [phone] Admissions: 8am-3pm M-F  [facility] [address]    [place], [place] [phone]   [facility] [address], [place], [place] [phone]   [facility] [address]  [place], [place] [phone]   [facility] Intensive Outpatient [address], [place], [place] [phone]   [facility] ([facility] [address]  [place], [place] [phone] or [phone]   [facility] ([facility]) [address] [place], [place] [phone] Accepts Medicaid  [facility] [address]  [place] [phone]) [phone] Substance Abuse/Addiction Treatment   [facility]         Address  Phone  Notes  [facility]  [phone](888) [phone]   Angie FavaJulie Brannon, PhD [address], [address] [place], [place]   [phone](336) [phone] or ([phone](336) [phone]   [facility]   [address] [place], [place] [phone](336) [phone]   [facility] [address], [place], [place] [phone](336) [phone] Insurance/Medicaid/sponsorship through [facility]  [facility] [address]                                    [place], [place] [phone](336) [phone] Therapy/tele-psych/case    [facility] [address]   [place], [place] [phone]) [phone]    Dr. Lolly MustacheArfeen  [phone]) [phone]   [facility]  [facility] 1) [address], [place] 2) [address], [place] 3)  [address], [place] [phone](336) [phone](336) [phone]  ([phone](336) ([phone]   [facility] Child [facility] (574) 471-2204(336) 534-294-1130 or (202)[phone](336) [phone] (After Hours)       Take the prescriptions as directed.  Apply moist heat or ice to the area(s) of discomfort, for 15 minutes at a time, several times per day for the next few days.  Do not fall asleep on a heating or ice pack.  Call your regular medical doctor today to schedule a follow up appointment within the next week.  Return to the Emergency Department immediately if worsening.

## 2013-12-17 NOTE — Discharge Planning (Signed)
[facility] Community Health & Eligibility Specialist   Spoke to patient regarding primary care resources and establishing care with a provider. Orange card application and instructions provided. Resource guide and my contact information provided for any future questions or concerns. No other needs identified at this time.

## 2013-12-17 NOTE — ED Notes (Signed)
Patient states started having chest pain with SOB X 1 week ago.   Patient states mid chest with no radiation.   NAD, A & O.

## 2013-12-17 NOTE — ED Notes (Signed)
Patient transported to X-ray 

## 2013-12-17 NOTE — ED Provider Notes (Signed)
CSN: [id number]     Arrival date & time 12/17/13  0815 History   First MD Initiated Contact with Patient 12/17/13 0830     Chief Complaint  Patient presents with  . Chest Pain     HPI Pt was seen at 0840. Per pt, c/o gradual onset and persistence of constant acute flair of her chronic headache since last night.  Describes the headache as per her usual chronic headache pain pattern for many years.  Denies headache was sudden or maximal in onset or at any time.  Denies visual changes, no focal motor weakness, no tingling/numbness in extremities, no fevers, no neck pain, no rash.  Pt also c/o gradual onset and persistence of constant mid-sternal chest "pain" for the past 1 week. Describes the pain as "stabbing," and "aching." States the CP makes her feel "SOB." Pt states the CP began after she was "carrying something heavy." CP worsens with palpation of the area and body position changes. Denies palpitations, no cough, no back pain, no abd pain, no N/V/D, no fevers, no rash.     Past Medical History  Diagnosis Date  . Anxiety   . Seizures   . Headache(784.0)   . Depression   . Amenorrhea 08/10/2012  . Hx of echocardiogram 07/2012    normal  . Chronic chest pain   . Palpitations 09/2012    30 day monitor with NSR/rare PAC's   Past Surgical History  Procedure Laterality Date  . Dilation and curettage of uterus     Family History  Problem Relation Age of Onset  . Heart disease Mother     Enlarged heart  . Hypertension Brother   . Asthma Son    History  Substance Use Topics  . Smoking status: Never Smoker   . Smokeless tobacco: Never Used  . Alcohol Use: Yes     Comment: Rarely   OB History   Grav Para Term Preterm Abortions TAB SAB Ect Mult Living   3 1   2  2   1      Review of Systems ROS: Statement: All systems negative except as marked or noted in the HPI; Constitutional: Negative for fever and chills. ; ; Eyes: Negative for eye pain, redness and discharge. ; ; ENMT:  Negative for ear pain, hoarseness, nasal congestion, sinus pressure and sore throat. ; ; Cardiovascular: +CP, SOB. Negative for palpitations, diaphoresis, and peripheral edema. ; ; Respiratory: Negative for cough, wheezing and stridor. ; ; Gastrointestinal: Negative for nausea, vomiting, diarrhea, abdominal pain, blood in stool, hematemesis, jaundice and rectal bleeding. . ; ; Genitourinary: Negative for dysuria, flank pain and hematuria. ; ; Musculoskeletal: Negative for back pain and neck pain. Negative for swelling and trauma.; ; Skin: Negative for pruritus, rash, abrasions, blisters, bruising and skin lesion.; ; Neuro: +headache. Negative for lightheadedness and neck stiffness. Negative for weakness, altered level of consciousness , altered mental status, extremity weakness, paresthesias, involuntary movement, seizure and syncope.      Allergies  Review of patient's allergies indicates no known allergies.  Home Medications   Prior to Admission medications   Medication Sig Start Date End Date Taking? Authorizing Provider  amoxicillin (AMOXIL) 500 MG capsule Take 500 mg by mouth 3 (three) times daily.    Historical Provider, MD  cyclobenzaprine (FLEXERIL) 10 MG tablet Take 1 tablet (10 mg total) by mouth 3 (three) times daily as needed for muscle spasms. 08/02/13   Graylon Good, PA-C  meloxicam (MOBIC) 15 MG tablet Take  1 tablet (15 mg total) by mouth daily. 08/02/13   Graylon GoodZachary H Baker, PA-C  Multiple Vitamin (MULTIVITAMIN WITH MINERALS) TABS Take 1 tablet by mouth daily.    Historical Provider, MD  Norethindrone Acetate-Ethinyl Estrad-FE (LOESTRIN 24 FE) 1-20 MG-MCG(24) tablet Take 1 tablet by mouth daily. 10/09/12   Lazaro ArmsLuther H Eure, MD  OVER THE COUNTER MEDICATION Take 1 tablet by mouth daily as needed. For stress (one a day women's for stress)    Historical Provider, MD   BP 109/59  Pulse 63  Temp(Src) 98.1 F (36.7 C) (Oral)  Resp 14  Ht 5\' 9"  (1.753 m)  Wt 160 lb (72.576 kg)  BMI 23.62  kg/m2  SpO2 100% Physical Exam 0845: Physical examination:  Nursing notes reviewed; Vital signs and O2 SAT reviewed;  Constitutional: Well developed, Well nourished, Well hydrated, In no acute distress; Head:  Normocephalic, atraumatic; Eyes: EOMI, PERRL, No scleral icterus; ENMT: Mouth and pharynx normal, Mucous membranes moist; Neck: Supple, Full range of motion, No lymphadenopathy; Cardiovascular: Regular rate and rhythm, No murmur, rub, or gallop; Respiratory: Breath sounds clear & equal bilaterally, No rales, rhonchi, wheezes.  Speaking full sentences with ease, Normal respiratory effort/excursion; Chest: +R>L parasternal and anterior chest wall areas tender to palp. No rash, no deformity, no soft tissue crepitus. Movement normal; Abdomen: Soft, Nontender, Nondistended, Normal bowel sounds; Genitourinary: No CVA tenderness; Extremities: Pulses normal, No tenderness, No edema, No calf edema or asymmetry.; Neuro: AA&Ox3, Major CN grossly intact.  Speech clear. No gross focal motor or sensory deficits in extremities.; Skin: Color normal, Warm, Dry.; Psych:  Anxious.  ED Course  Procedures     EKG Interpretation   Date/Time:  Friday December 17 2013 08:19:43 EDT Ventricular Rate:  69 PR Interval:  166 QRS Duration: 72 QT Interval:  398 QTC Calculation: 426 R Axis:   76 Text Interpretation:  Normal sinus rhythm Normal ECG When compared with  ECG of 01/14/2012 No significant change was found Confirmed by [facility]   MD, Nicholos JohnsKATHLEEN [phone]([id number]) on 12/17/2013 8:46:49 AM      MDM  MDM Reviewed: previous chart, nursing note and vitals Reviewed previous: labs, ECG and ultrasound Interpretation: labs, ECG and x-ray    Results for orders placed during the hospital encounter of 12/17/13  I-STAT TROPOININ, ED      Result Value Ref Range   Troponin i, poc 0.01  0.00 - 0.08 ng/mL   Comment 3           I-STAT CHEM 8, ED      Result Value Ref Range   Sodium 140  137 - 147 mEq/L   Potassium 3.8   3.7 - 5.3 mEq/L   Chloride 104  96 - 112 mEq/L   BUN 15  6 - 23 mg/dL   Creatinine, Ser 6.040.90  0.50 - 1.10 mg/dL   Glucose, Bld 92  70 - 99 mg/dL   Calcium, Ion 5.401.25 (*) 1.12 - 1.23 mmol/L   TCO2 27  0 - 100 mmol/L   Hemoglobin 13.9  12.0 - 15.0 g/dL   HCT 98.141.0  19.136.0 - 47.846.0 %   Dg Chest 2 View 12/17/2013   CLINICAL DATA:  Cough.  EXAM: CHEST  2 VIEW  COMPARISON:  CT 01/14/2012.  FINDINGS: The heart size and mediastinal contours are within normal limits. Both lungs are clear. The visualized skeletal structures are unremarkable.  IMPRESSION: No active cardiopulmonary disease.   Electronically Signed   By: Maisie Fushomas  Register   On: 12/17/2013  09:10    1025:  Doubt PE as cause for symptoms with low risk [place].  Doubt ACS as cause for symptoms with normal troponin and unchanged EKG from previous after 1 week of constant symptoms. Pt states she feels better after pain meds and wants to go home now. Will tx symptomatically at this time. Dx and testing d/w pt.  Questions answered.  Verb understanding, agreeable to d/c home with outpt f/u.    Samuel JesterKathleen Corbet [place], DO 12/20/13 [phone]

## 2014-01-03 ENCOUNTER — Encounter (HOSPITAL_COMMUNITY): Payer: Self-pay | Admitting: Emergency Medicine

## 2014-04-28 ENCOUNTER — Emergency Department (HOSPITAL_COMMUNITY): Payer: No Typology Code available for payment source

## 2014-04-28 ENCOUNTER — Encounter (HOSPITAL_COMMUNITY): Payer: Self-pay

## 2014-04-28 ENCOUNTER — Emergency Department (HOSPITAL_COMMUNITY)
Admission: EM | Admit: 2014-04-28 | Discharge: 2014-04-28 | Disposition: A | Discharge status: Home / Self Care | Payer: No Typology Code available for payment source | Attending: Emergency Medicine | Admitting: Emergency Medicine

## 2014-04-28 DIAGNOSIS — M549 Dorsalgia, unspecified: Secondary | ICD-10-CM

## 2014-04-28 DIAGNOSIS — Y92410 Unspecified street and highway as the place of occurrence of the external cause: Secondary | ICD-10-CM | POA: Insufficient documentation

## 2014-04-28 DIAGNOSIS — S3992XA Unspecified injury of lower back, initial encounter: Secondary | ICD-10-CM | POA: Insufficient documentation

## 2014-04-28 DIAGNOSIS — Y998 Other external cause status: Secondary | ICD-10-CM | POA: Insufficient documentation

## 2014-04-28 DIAGNOSIS — Z8659 Personal history of other mental and behavioral disorders: Secondary | ICD-10-CM | POA: Diagnosis not present

## 2014-04-28 DIAGNOSIS — Z79899 Other long term (current) drug therapy: Secondary | ICD-10-CM | POA: Insufficient documentation

## 2014-04-28 DIAGNOSIS — Z8742 Personal history of other diseases of the female genital tract: Secondary | ICD-10-CM | POA: Insufficient documentation

## 2014-04-28 DIAGNOSIS — Y9389 Activity, other specified: Secondary | ICD-10-CM | POA: Insufficient documentation

## 2014-04-28 DIAGNOSIS — G8929 Other chronic pain: Secondary | ICD-10-CM | POA: Insufficient documentation

## 2014-04-28 DIAGNOSIS — Z3202 Encounter for pregnancy test, result negative: Secondary | ICD-10-CM | POA: Insufficient documentation

## 2014-04-28 LAB — URINALYSIS, ROUTINE W REFLEX MICROSCOPIC
Bilirubin Urine: NEGATIVE
Glucose, UA: NEGATIVE mg/dL
Hgb urine dipstick: NEGATIVE
Ketones, ur: NEGATIVE mg/dL
Nitrite: NEGATIVE
Protein, ur: NEGATIVE mg/dL
Specific Gravity, Urine: 1.005 (ref 1.005–1.030)
Urobilinogen, UA: 0.2 mg/dL (ref 0.0–1.0)
pH: 7.5 (ref 5.0–8.0)

## 2014-04-28 LAB — URINE MICROSCOPIC-ADD ON

## 2014-04-28 LAB — POC URINE PREG, ED: Preg Test, Ur: NEGATIVE

## 2014-04-28 MED ORDER — OXYCODONE-ACETAMINOPHEN 5-325 MG PO TABS
1.0000 | ORAL_TABLET | Freq: Once | ORAL | Status: AC
  Administered 2014-04-28: 1 via ORAL
  Filled 2014-04-28: qty 1

## 2014-04-28 MED ORDER — HYDROCODONE-ACETAMINOPHEN 5-325 MG PO TABS: 1.0000 | ORAL_TABLET | ORAL | Status: DC | PRN

## 2014-04-28 NOTE — ED Notes (Signed)
Dr. Gentry at the bedside.  
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
## 2014-04-28 NOTE — Discharge Instructions (Signed)
Back Pain, Adult °Low back pain is very common. About 1 in 5 people have back pain. The cause of low back pain is rarely dangerous. The pain often gets better over time. About half of people with a sudden onset of back pain feel better in just 2 weeks. About 8 in 10 people feel better by 6 weeks.  °CAUSES °Some common causes of back pain include: °· Strain of the muscles or ligaments supporting the spine. °· Wear and tear (degeneration) of the spinal discs. °· Arthritis. °· Direct injury to the back. °DIAGNOSIS °Most of the time, the direct cause of low back pain is not known. However, back pain can be treated effectively even when the exact cause of the pain is unknown. Answering your caregiver's questions about your overall health and symptoms is one of the most accurate ways to make sure the cause of your pain is not dangerous. If your caregiver needs more information, he or she may order lab work or imaging tests (X-rays or MRIs). However, even if imaging tests show changes in your back, this usually does not require surgery. °HOME CARE INSTRUCTIONS °For many people, back pain returns. Since low back pain is rarely dangerous, it is often a condition that people can learn to manage on their own.  °· Remain active. It is stressful on the back to sit or stand in one place. Do not sit, drive, or stand in one place for more than 30 minutes at a time. Take short walks on level surfaces as soon as pain allows. Try to increase the length of time you walk each day. °· Do not stay in bed. Resting more than 1 or 2 days can delay your recovery. °· Do not avoid exercise or work. Your body is made to move. It is not dangerous to be active, even though your back may hurt. Your back will likely heal faster if you return to being active before your pain is gone. °· Pay attention to your body when you  bend and lift. Many people have less discomfort when lifting if they bend their knees, keep the load close to their bodies, and  avoid twisting. Often, the most comfortable positions are those that put less stress on your recovering back. °· Find a comfortable position to sleep. Use a firm mattress and lie on your side with your knees slightly bent. If you lie on your back, put a pillow under your knees. °· Only take over-the-counter or prescription medicines as directed by your caregiver. Over-the-counter medicines to reduce pain and inflammation are often the most helpful. Your caregiver may prescribe muscle relaxant drugs. These medicines help dull your pain so you can more quickly return to your normal activities and healthy exercise. °· Put ice on the injured area. °· Put ice in a plastic bag. °· Place a towel between your skin and the bag. °· Leave the ice on for 15-20 minutes, 03-04 times a day for the first 2 to 3 days. After that, ice and heat may be alternated to reduce pain and spasms. °· Ask your caregiver about trying back exercises and gentle massage. This may be of some benefit. °· Avoid feeling anxious or stressed. Stress increases muscle tension and can worsen back pain. It is important to recognize when you are anxious or stressed and learn ways to manage it. Exercise is a great option. °SEEK MEDICAL CARE IF: °· You have pain that is not relieved with rest or medicine. °· You have pain that does not improve in 1 week. °· You have new symptoms. °· You are generally not feeling well. °SEEK   IMMEDIATE MEDICAL CARE IF:  °· You have pain that radiates from your back into your legs. °· You develop new bowel or bladder control problems. °· You have unusual weakness or numbness in your arms or legs. °· You develop nausea or vomiting. °· You develop abdominal pain. °· You feel faint. °Document Released: 02/18/2005 Document Revised: 08/20/2011 Document Reviewed: 06/22/2013 °[employer]® Patient Information ©2015 [employer]. This information is not intended to replace advice given to you by your health care provider. Make sure you  discuss any questions you have with your health care provider. ° °Motor Vehicle Collision °It is common to have multiple bruises and sore muscles after a motor vehicle collision (MVC). These tend to feel worse for the first 24 hours. You may have the most stiffness and soreness over the first several hours. You may also feel worse when you wake up the first morning after your collision. After this point, you will usually begin to improve with each day. The speed of improvement often depends on the severity of the collision, the number of injuries, and the location and nature of these injuries. °HOME CARE INSTRUCTIONS °· Put ice on the injured area. °¨ Put ice in a plastic bag. °¨ Place a towel between your skin and the bag. °¨ Leave the ice on for 15-20 minutes, 3-4 times a day, or as directed by your health care provider. °· Drink enough fluids to keep your urine clear or pale yellow. Do not drink alcohol. °· Take a warm shower or bath once or twice a day. This will increase blood flow to sore muscles. °· You may return to activities as directed by your caregiver. Be careful when lifting, as this may aggravate neck or back pain. °· Only take over-the-counter or prescription medicines for pain, discomfort, or fever as directed by your caregiver. Do not use aspirin. This may increase bruising and bleeding. °SEEK IMMEDIATE MEDICAL CARE IF: °· You have numbness, tingling, or weakness in the arms or legs. °· You develop severe headaches not relieved with medicine. °· You have severe neck pain, especially tenderness in the middle of the back of your neck. °· You have changes in bowel or bladder control. °· There is increasing pain in any area of the body. °· You have shortness of breath, light-headedness, dizziness, or fainting. °· You have chest pain. °· You feel sick to your stomach (nauseous), throw up (vomit), or sweat. °· You have increasing abdominal discomfort. °· There is blood in your urine, stool, or  vomit. °· You have pain in your shoulder (shoulder strap areas). °· You feel your symptoms are getting worse. °MAKE SURE YOU: °· Understand these instructions. °· Will watch your condition. °· Will get help right away if you are not doing well or get worse. °Document Released: 02/18/2005 Document Revised: 07/05/2013 Document Reviewed: 07/18/2010 °[employer]® Patient Information ©2015 [employer]. This information is not intended to replace advice given to you by your health care provider. Make sure you discuss any questions you have with your health care provider. ° °
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
## 2014-04-28 NOTE — ED Notes (Signed)
EDP at bedside  

## 2014-04-28 NOTE — ED Notes (Signed)
Pt placed on fracture pan for ua

## 2014-08-22 ENCOUNTER — Other Ambulatory Visit: Payer: Self-pay | Admitting: Specialist

## 2014-08-22 DIAGNOSIS — Z780 Asymptomatic menopausal state: Secondary | ICD-10-CM

## 2014-08-29 ENCOUNTER — Inpatient Hospital Stay: Admission: RE | Admit: 2014-08-29 | Payer: Self-pay | Source: Ambulatory Visit

## 2014-09-02 ENCOUNTER — Other Ambulatory Visit: Payer: Self-pay

## 2014-09-14 ENCOUNTER — Inpatient Hospital Stay: Admission: RE | Admit: 2014-09-14 | Payer: Self-pay | Source: Ambulatory Visit

## 2015-04-01 DIAGNOSIS — Y998 Other external cause status: Secondary | ICD-10-CM | POA: Insufficient documentation

## 2015-04-01 DIAGNOSIS — Y92009 Unspecified place in unspecified non-institutional (private) residence as the place of occurrence of the external cause: Secondary | ICD-10-CM | POA: Insufficient documentation

## 2015-04-01 DIAGNOSIS — G8929 Other chronic pain: Secondary | ICD-10-CM | POA: Insufficient documentation

## 2015-04-01 DIAGNOSIS — S99912A Unspecified injury of left ankle, initial encounter: Secondary | ICD-10-CM | POA: Insufficient documentation

## 2015-04-01 DIAGNOSIS — Z3202 Encounter for pregnancy test, result negative: Secondary | ICD-10-CM | POA: Insufficient documentation

## 2015-04-01 DIAGNOSIS — W108XXA Fall (on) (from) other stairs and steps, initial encounter: Secondary | ICD-10-CM | POA: Insufficient documentation

## 2015-04-01 DIAGNOSIS — Y9389 Activity, other specified: Secondary | ICD-10-CM | POA: Insufficient documentation

## 2015-04-01 DIAGNOSIS — Z79899 Other long term (current) drug therapy: Secondary | ICD-10-CM | POA: Insufficient documentation

## 2015-04-01 DIAGNOSIS — S60416A Abrasion of right little finger, initial encounter: Secondary | ICD-10-CM | POA: Insufficient documentation

## 2015-04-01 DIAGNOSIS — Z8659 Personal history of other mental and behavioral disorders: Secondary | ICD-10-CM | POA: Insufficient documentation

## 2015-04-01 DIAGNOSIS — S6992XA Unspecified injury of left wrist, hand and finger(s), initial encounter: Secondary | ICD-10-CM | POA: Insufficient documentation

## 2015-04-01 DIAGNOSIS — S8992XA Unspecified injury of left lower leg, initial encounter: Secondary | ICD-10-CM | POA: Insufficient documentation

## 2015-04-01 DIAGNOSIS — Z8742 Personal history of other diseases of the female genital tract: Secondary | ICD-10-CM | POA: Insufficient documentation
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
## 2015-04-02 ENCOUNTER — Emergency Department (HOSPITAL_BASED_OUTPATIENT_CLINIC_OR_DEPARTMENT_OTHER): Payer: Self-pay

## 2015-04-02 ENCOUNTER — Encounter (HOSPITAL_BASED_OUTPATIENT_CLINIC_OR_DEPARTMENT_OTHER): Payer: Self-pay | Admitting: *Deleted

## 2015-04-02 ENCOUNTER — Emergency Department (HOSPITAL_BASED_OUTPATIENT_CLINIC_OR_DEPARTMENT_OTHER)
Admission: EM | Admit: 2015-04-02 | Discharge: 2015-04-02 | Disposition: A | Discharge status: Home / Self Care | Payer: Self-pay | Attending: Emergency Medicine | Admitting: Emergency Medicine

## 2015-04-02 DIAGNOSIS — W19XXXA Unspecified fall, initial encounter: Secondary | ICD-10-CM

## 2015-04-02 LAB — PREGNANCY, URINE: Preg Test, Ur: NEGATIVE

## 2015-04-02 MED ORDER — METHOCARBAMOL 500 MG PO TABS
1000.0000 mg | ORAL_TABLET | Freq: Once | ORAL | Status: AC
  Administered 2015-04-02: 1000 mg via ORAL
  Filled 2015-04-02: qty 2

## 2015-04-02 MED ORDER — KETOROLAC TROMETHAMINE 60 MG/2ML IM SOLN
60.0000 mg | Freq: Once | INTRAMUSCULAR | Status: AC
  Administered 2015-04-02: 60 mg via INTRAMUSCULAR
  Filled 2015-04-02: qty 2

## 2015-04-02 MED ORDER — METHOCARBAMOL 500 MG PO TABS: 500.0000 mg | ORAL_TABLET | Freq: Two times a day (BID) | ORAL | Status: DC

## 2015-04-02 MED ORDER — NAPROXEN 375 MG PO TABS: 375.0000 mg | ORAL_TABLET | Freq: Two times a day (BID) | ORAL | Status: DC

## 2015-04-02 NOTE — ED Notes (Signed)
Dr. Palumbo in to room. 

## 2015-04-02 NOTE — ED Notes (Signed)
Pt ambulatory to b/r with slow cautious steady gait with limp

## 2015-04-02 NOTE — Discharge Instructions (Signed)

## 2015-04-02 NOTE — ED Notes (Signed)
To xray/ CT

## 2015-04-02 NOTE — ED Provider Notes (Signed)
CSN: [id number]     Arrival date & time 04/01/15  2347 History  By signing my name below, I, Bethel Born, attest that this documentation has been prepared under the direction and in the presence of Rhiann Boucher, MD. Electronically Signed: Bethel Born, ED Scribe. 04/02/2015. 12:24 AM   Chief Complaint  Patient presents with  . Fall    Patient is a 45 y.o. female presenting with fall. The history is provided by the patient. No language interpreter was used.  Fall This is a new problem. The current episode started 1 to 2 hours ago. The problem occurs constantly. The problem has not changed since onset.Pertinent negatives include no chest pain, no abdominal pain, no headaches and no shortness of breath. Nothing aggravates the symptoms. Nothing relieves the symptoms. She has tried nothing for the symptoms. The treatment provided no relief.   Katie Chaney is a 45 y.o. female who presents to the Emergency Department complaining of a mechanical fall 1 hour PTA. Pt was going down the stairs of her home when she tripped and fell down 8 stairs on her left side. Associated symptoms include pain at the left knee, left ankle, and left wrist. No intervention was performed PTA.  Pt denies LOC, vomiting, and seizure-like activity. LNMP was 3 years ago.   Past Medical History  Diagnosis Date  . Anxiety   . Seizures (HCC)   . Headache(784.0)   . Depression   . Amenorrhea 08/10/2012  . Hx of echocardiogram 07/2012    normal  . Chronic chest pain   . Palpitations 09/2012    30 day monitor with NSR/rare PAC's   Past Surgical History  Procedure Laterality Date  . Dilation and curettage of uterus     Family History  Problem Relation Age of Onset  . Heart disease Mother     Enlarged heart  . Hypertension Brother   . Asthma Son    Social History  Substance Use Topics  . Smoking status: Never Smoker   . Smokeless tobacco: Never Used  . Alcohol Use: Yes     Comment: Rarely   OB History     Gravida Para Term Preterm AB TAB SAB Ectopic Multiple Living   Review of Systems  Respiratory: Negative for shortness of breath.   Cardiovascular: Negative for chest pain.  Gastrointestinal: Negative for vomiting and abdominal pain.  Musculoskeletal: Negative for gait problem, neck pain and neck stiffness.  Neurological: Negative for dizziness, seizures, syncope, facial asymmetry, speech difficulty, weakness, numbness and headaches.  All other systems reviewed and are negative.     Allergies  Review of patient's allergies indicates no known allergies.  Home Medications   Prior to Admission medications   Medication Sig Start Date End Date Taking? Authorizing Provider  Ascorbic Acid (VITAMIN C PO) Take 1 tablet by mouth daily.    Historical Provider, MD  B Complex Vitamins (B COMPLEX PO) Take 1 capsule by mouth daily.    Historical Provider, MD  CALCIUM PO Take 1 tablet by mouth daily.    Historical Provider, MD  HYDROcodone-acetaminophen (NORCO/VICODIN) 5-325 MG per tablet Take 1-2 tablets by mouth every 4 (four) hours as needed. 04/28/14   Mirian Mo, MD  methocarbamol (ROBAXIN) 500 MG tablet Take 2 tablets (1,000 mg total) by mouth 4 (four) times daily as needed for muscle spasms (muscle spasm/pain). Patient not taking: Reported on 04/28/2014 12/17/13   Samuel Jester,  DO  Misc Natural Products (JOINT SUPPORT PO) Take 1 capsule by mouth daily.    Historical Provider, MD  naproxen (NAPROSYN) 250 MG tablet Take 1 tablet (250 mg total) by mouth 2 (two) times daily as needed for mild pain or moderate pain (take with food). Patient not taking: Reported on 04/28/2014 12/17/13   Samuel Jester, DO  naproxen sodium (ANAPROX) 220 MG tablet Take 440 mg by mouth 2 (two) times daily as needed (for pain).    Historical Provider, MD  Prenatal Vit-Fe Fumarate-FA (PRENATAL PO) Take 1 tablet by mouth daily.    Historical Provider, MD   BP 132/72 mmHg  Pulse 58   Temp(Src) 98.1 F (36.7 C) (Oral)  Resp 20  Ht  (1.753 m)  Wt 150 lb (68.04 kg)  BMI 22.14 kg/m2  SpO2 100% Physical Exam  Constitutional: She is oriented to person, place, and time. She appears well-developed and well-nourished.  HENT:  Head: Normocephalic and atraumatic. Head is without raccoon's eyes and without Battle's sign.  Right Ear: No mastoid tenderness. No hemotympanum.  Left Ear: No mastoid tenderness. No hemotympanum.  Nose: No nasal septal hematoma.  Mouth/Throat: Oropharynx is clear and moist.  Moist mucous membranes No exudate No battle sign No racoon eyes No hemotympanum bilaterally  Eyes: EOM are normal. Pupils are equal, round, and reactive to light.  Neck: Normal range of motion. Neck supple.  Trachea midline No bruit   Cardiovascular: Normal rate and regular rhythm.   Pulses:      Dorsalis pedis pulses are 2+ on the left side.  Pulmonary/Chest: Breath sounds normal. No stridor. No respiratory distress. She has no wheezes. She has no rales. She exhibits no tenderness.  CTAB  Abdominal: Soft. Bowel sounds are normal. She exhibits no mass. There is no tenderness. There is no rebound and no guarding.  Musculoskeletal: Normal range of motion. She exhibits no edema or tenderness.       Left shoulder: Normal.       Left elbow: Normal.       Left wrist: Normal.       Left hip: Normal.       Left knee: Normal.       Left ankle: Normal. Achilles tendon normal.       Cervical back: Normal.       Thoracic back: Normal.       Lumbar back: Normal.       Left foot: Normal.  No step offs, point tenderness, or crepitance of the cervical, thoracic, lumbar, or sacral spine. Abrasion to right 5th finger. Left hand NVI with no deformity or bruising. No effusion at the left elbow No deformity or dislocation at the left shoulder. Negative Neer's test. 5/5 strength at the LUE Pelvis is stable. Negative anterior/ posterior drawer test. No laxity to varus or valgus  stress. No step off of the tibial plateau. No effusion at the left knee. Left mid foot is stable with no deformity. Intact plantar fascia. All compartments of LLE are soft.  Neurological: She is alert and oriented to person, place, and time. She has normal reflexes.  Reflex Scores:      Tricep reflexes are 2+ on the left side.      Bicep reflexes are 2+ on the left side.      Patellar reflexes are 2+ on the left side.      Achilles reflexes are 2+ on the left side. Skin: Skin is warm and dry.  Psychiatric: She has  a normal mood and affect. Her behavior is normal.  Nursing note and vitals reviewed.   ED Course  Procedures (including critical care time) DIAGNOSTIC STUDIES: Oxygen Saturation is 100% on RA,  normal by my interpretation.    COORDINATION OF CARE: 12:31 AM Discussed treatment plan which includes nsaids and ice with pt at bedside and pt agreed to plan.  Labs Review Labs Reviewed - No data to display  Imaging Review No results found. I have personally reviewed and evaluated these images and lab results as part of my medical decision-making.   EKG Interpretation None      MDM   Final diagnoses:  None    No seizures no vomiting no LOC no signs of head trauma.  Patient is sleeping in room.  All xrays are normal.  Contusions from fall will treat symptomatically   I personally performed the services described in this documentation, which was scribed in my presence. The recorded information has been reviewed and is accurate.      Cy Blamer, MD 04/02/15 [phone]

## 2015-04-02 NOTE — ED Notes (Addendum)
Back from radiology, no changes, alert, NAD, calm, interactive, no dyspnea.

## 2015-06-30 ENCOUNTER — Other Ambulatory Visit (HOSPITAL_BASED_OUTPATIENT_CLINIC_OR_DEPARTMENT_OTHER): Payer: Self-pay | Admitting: Physician Assistant

## 2015-06-30 DIAGNOSIS — R102 Pelvic and perineal pain: Secondary | ICD-10-CM

## 2015-07-19 ENCOUNTER — Ambulatory Visit (HOSPITAL_BASED_OUTPATIENT_CLINIC_OR_DEPARTMENT_OTHER): Admission: RE | Admit: 2015-07-19 | Payer: 59 | Source: Ambulatory Visit

## 2016-02-20 ENCOUNTER — Emergency Department (HOSPITAL_BASED_OUTPATIENT_CLINIC_OR_DEPARTMENT_OTHER)
Admission: EM | Admit: 2016-02-20 | Discharge: 2016-02-20 | Disposition: A | Discharge status: Home / Self Care | Payer: BLUE CROSS/BLUE SHIELD | Attending: Emergency Medicine | Admitting: Emergency Medicine

## 2016-02-20 ENCOUNTER — Emergency Department (HOSPITAL_BASED_OUTPATIENT_CLINIC_OR_DEPARTMENT_OTHER): Payer: BLUE CROSS/BLUE SHIELD

## 2016-02-20 ENCOUNTER — Encounter (HOSPITAL_BASED_OUTPATIENT_CLINIC_OR_DEPARTMENT_OTHER): Payer: Self-pay | Admitting: Respiratory Therapy

## 2016-02-20 DIAGNOSIS — M7989 Other specified soft tissue disorders: Secondary | ICD-10-CM | POA: Insufficient documentation

## 2016-02-20 DIAGNOSIS — R0789 Other chest pain: Secondary | ICD-10-CM | POA: Diagnosis not present

## 2016-02-20 LAB — TROPONIN I: Troponin I: <0.03 ng/mL (ref <0.03)

## 2016-02-20 LAB — CBC WITH DIFFERENTIAL/PLATELET
Basophils Absolute: 0 10*3/uL (ref 0.0–0.1)
Basophils Relative: 1 %
Eosinophils Absolute: 0.2 10*3/uL (ref 0.0–0.7)
Eosinophils Relative: 3 %
HCT: 38.8 % (ref 36.0–46.0)
Hemoglobin: 12.7 g/dL (ref 12.0–15.0)
Lymphocytes Relative: 50 %
Lymphs Abs: 3.3 10*3/uL (ref 0.7–4.0)
MCH: 30.4 pg (ref 26.0–34.0)
MCHC: 32.7 g/dL (ref 30.0–36.0)
MCV: 92.8 fL (ref 78.0–100.0)
Monocytes Absolute: 0.7 10*3/uL (ref 0.1–1.0)
Monocytes Relative: 10 %
Neutro Abs: 2.4 10*3/uL (ref 1.7–7.7)
Neutrophils Relative %: 36 %
Platelets: 185 10*3/uL (ref 150–400)
RBC: 4.18 MIL/uL (ref 3.87–5.11)
RDW: 11.9 % (ref 11.5–15.5)
WBC: 6.6 10*3/uL (ref 4.0–10.5)

## 2016-02-20 LAB — COMPREHENSIVE METABOLIC PANEL
ALT: 18 U/L (ref 14–54)
AST: 24 U/L (ref 15–41)
Albumin: 4.2 g/dL (ref 3.5–5.0)
Alkaline Phosphatase: 87 U/L (ref 38–126)
Anion gap: 8 (ref 5–15)
BUN: 21 mg/dL — ABNORMAL HIGH (ref 6–20)
CO2: 28 mmol/L (ref 22–32)
Calcium: 9.7 mg/dL (ref 8.9–10.3)
Chloride: 104 mmol/L (ref 101–111)
Creatinine, Ser: 0.76 mg/dL (ref 0.44–1.00)
GFR calc Af Amer: >60 mL/min (ref >60)
GFR calc non Af Amer: >60 mL/min (ref >60)
Glucose, Bld: 94 mg/dL (ref 65–99)
Potassium: 4.1 mmol/L (ref 3.5–5.1)
Sodium: 140 mmol/L (ref 135–145)
Total Bilirubin: 0.4 mg/dL (ref 0.3–1.2)
Total Protein: 7.7 g/dL (ref 6.5–8.1)

## 2016-02-20 NOTE — ED Provider Notes (Signed)
[facility] Provider Note   CSN: [id number] Arrival date & time: 02/20/16  2044  By signing my name below, I, Emmanuella Mensah, attest that this documentation has been prepared under the direction and in the presence of Melene Planan Veronnica Hennings, DO. Electronically Signed: Angelene GiovanniEmmanuella Mensah, ED Scribe. 02/20/16. 11:00 PM.   History   Chief Complaint Chief Complaint  Patient presents with  . Chest Pain    HPI Comments: Katie Chaney is a 45 y.o. female with a hx of anxiety and chronic chest pain who presents to the Emergency Department complaining of gradual onset, persistent moderate non-radiating substernal chest pain she describes as pressure onset 5 days ago. She notes that the chest pain is worse upon exertion, palpation, and laying on her side. She reports associated mild bilateral leg swelling. She denies any recent falls, injuries, or trauma to the chest. No alleviating factors noted. Pt has not tried any medications PTA. She has NKDA. She denies a hx of PE/DVT or cancers. She reports that she recently had a 4 hour plane trip to [place] but denies any recent surgeries or hormone therapy. She reports that she was evaluated by a Cardiologist in 2013 for chronic chest pain where she had an echocardiogram with no definitive diagnosis. She denies any fever, chills, cough, nausea, vomiting, shortness of breath, diaphoresis, or any other symptoms.   The history is provided by the patient. No language interpreter was used.  Chest Pain   This is a new problem. The current episode started more than 2 days ago. The problem has not changed since onset.The pain is present in the substernal region. The pain is moderate. The quality of the pain is described as pressure-like. The pain does not radiate. The symptoms are aggravated by exertion and certain positions. Pertinent negatives include no cough, no diaphoresis, no dizziness, no fever, no headaches, no nausea, no palpitations, no shortness of breath  and no vomiting. She has tried nothing for the symptoms. The treatment provided no relief. There are no known risk factors.  Pertinent negatives for past medical history include no DVT and no PE.  Procedure history is positive for echocardiogram.    Past Medical History:  Diagnosis Date  . Amenorrhea 08/10/2012  . Anxiety   . Chronic chest pain   . Depression   . Headache(784.0)   . Hx of echocardiogram 07/2012   normal  . Palpitations 09/2012   30 day monitor with NSR/rare PAC's  . Seizures [facility](HCC)     Patient Active Problem List   Diagnosis Date Noted  . Early menopause 10/09/2012  . Amenorrhea 08/10/2012  . Palpitations 07/23/2012  . Seizures (HCC)   . Anxiety   . Chest pain     Past Surgical History:  Procedure Laterality Date  . DILATION AND CURETTAGE OF UTERUS      OB History    Gravida Para Term Preterm AB Living   3 1     2 1    SAB TAB Ectopic Multiple Live Births   2               Home Medications    Prior to Admission medications   Not on File    Family History Family History  Problem Relation Age of Onset  . Heart disease Mother     Enlarged heart  . Hypertension Brother   . Asthma Son     Social History Social History  Substance Use Topics  . Smoking status: Never Smoker  . Smokeless  tobacco: Never Used  . Alcohol use Yes     Comment: Rarely     Allergies   Patient has no known allergies.   Review of Systems Review of Systems  Constitutional: Negative for chills, diaphoresis and fever.  HENT: Negative for congestion and rhinorrhea.   Eyes: Negative for redness and visual disturbance.  Respiratory: Negative for cough, shortness of breath and wheezing.   Cardiovascular: Positive for chest pain and leg swelling. Negative for palpitations.  Gastrointestinal: Negative for nausea and vomiting.  Genitourinary: Negative for dysuria and urgency.  Musculoskeletal: Negative for arthralgias and myalgias.  Skin: Negative for pallor and wound.   Neurological: Negative for dizziness and headaches.     Physical Exam Updated Vital Signs BP 91/73   Pulse (!) 53   Resp 14   LMP 03/05/2011   SpO2 100%   Physical Exam  Constitutional: She is oriented to person, place, and time. She appears well-developed and well-nourished. No distress.  HENT:  Head: Normocephalic and atraumatic.  Eyes: EOM are normal. Pupils are equal, round, and reactive to light.  Neck: Normal range of motion. Neck supple.  Cardiovascular: Normal rate and regular rhythm.  Exam reveals no gallop and no friction rub.   No murmur heard. Pulmonary/Chest: Effort normal and breath sounds normal. She has no wheezes. She has no rales. She exhibits tenderness.  TTP to anterior chest wall which reproduces her pain  Abdominal: Soft. She exhibits no distension. There is no tenderness.  Musculoskeletal: She exhibits no edema or tenderness.  Neurological: She is alert and oriented to person, place, and time.  Skin: Skin is warm and dry. She is not diaphoretic.  Psychiatric: She has a normal mood and affect. Her behavior is normal.  Nursing note and vitals reviewed.    ED Treatments / Results  DIAGNOSTIC STUDIES: Oxygen Saturation is 100% on room air, normal by my interpretation.    COORDINATION OF CARE: 9:51 PM- Pt advised of plan for treatment and pt agrees. Pt will receive troponin, CMP, CBC, and EKG for further evaluation.    Labs (all labs ordered are listed, but only abnormal results are displayed) Labs Reviewed  COMPREHENSIVE METABOLIC PANEL - Abnormal; Notable for the following:       Result Value   BUN 21 (*)    All other components within normal limits  CBC WITH DIFFERENTIAL/PLATELET  TROPONIN I    EKG  EKG Interpretation  Date/Time:  Tuesday February 20 2016 21:05:58 EST Ventricular Rate:  71 PR Interval:    QRS Duration: 78 QT Interval:  388 QTC Calculation: 422 R Axis:   58 Text Interpretation:  Sinus rhythm Baseline wander in lead(s)  V6 No significant change since last tracing Confirmed by Krist Rosenboom MD, DANIEL [phone]([id number]) on 02/20/2016 10:11:16 PM       Radiology Dg Chest 2 View  Result Date: 02/20/2016 CLINICAL DATA:  Midsternal chest pain for 5 days.  Some dyspnea. EXAM: CHEST  2 VIEW COMPARISON:  12/17/2013 FINDINGS: The heart size and mediastinal contours are within normal limits. Both lungs are clear. The visualized skeletal structures are unremarkable. IMPRESSION: No active cardiopulmonary disease. Electronically Signed   By: Ellery Plunkaniel R Mitchell M.D.   On: 02/20/2016 22:29    Procedures Procedures (including critical care time)  Medications Ordered in ED Medications - No data to display   Initial Impression / Assessment and Plan / ED Course  Melene Planan Quinta Eimer, DO has reviewed the triage vital signs and the nursing notes.  Pertinent labs &  imaging results that were available during my care of the patient were reviewed by me and considered in my medical decision making (see chart for details).  Clinical Course     45 yo F with with a cc of chest pain.  Worse with palpation and twisting.  Reproduced on exam.  Labs ordered in triage unremarkable.   11:00 PM:  I have discussed the diagnosis/risks/treatment options with the patient and family and believe the pt to be eligible for discharge home to follow-up with PCP. We also discussed returning to the ED immediately if new or worsening sx occur. We discussed the sx which are most concerning (e.g., sudden worsening pain, fever, inability to tolerate by mouth) that necessitate immediate return. Medications administered to the patient during their visit and any new prescriptions provided to the patient are listed below.  Medications given during this visit Medications - No data to display   The patient appears reasonably screen and/or stabilized for discharge and I doubt any other medical condition or other [facility] requiring further screening, evaluation, or treatment in the ED at this  time prior to discharge.    Final Clinical Impressions(s) / ED Diagnoses   Final diagnoses:  Chest wall pain    New Prescriptions New Prescriptions   No medications on file   I personally performed the services described in this documentation, which was scribed in my presence. The recorded information has been reviewed and is accurate.      Melene Plan, DO 02/20/16 2300

## 2016-02-20 NOTE — ED Triage Notes (Signed)
Pt reports substernal CP x 5 days.  Hx of same.  Has seen cardiology without dx.  Denies N/V.  Reports SOB

## 2016-02-20 NOTE — Discharge Instructions (Signed)
Take 4 over the counter ibuprofen tablets 3 times a day or 2 over-the-counter naproxen tablets twice a day for pain. Also take tylenol 1000mg(2 extra strength) four times a day.    

## 2016-05-10 ENCOUNTER — Encounter (HOSPITAL_BASED_OUTPATIENT_CLINIC_OR_DEPARTMENT_OTHER): Payer: Self-pay | Admitting: Emergency Medicine

## 2016-05-10 ENCOUNTER — Emergency Department (HOSPITAL_BASED_OUTPATIENT_CLINIC_OR_DEPARTMENT_OTHER)
Admission: EM | Admit: 2016-05-10 | Discharge: 2016-05-10 | Disposition: A | Discharge status: Home / Self Care | Payer: BLUE CROSS/BLUE SHIELD | Attending: Emergency Medicine | Admitting: Emergency Medicine

## 2016-05-10 DIAGNOSIS — F959 Tic disorder, unspecified: Secondary | ICD-10-CM | POA: Diagnosis not present

## 2016-05-10 DIAGNOSIS — M62838 Other muscle spasm: Secondary | ICD-10-CM | POA: Diagnosis present

## 2016-05-10 NOTE — ED Provider Notes (Signed)
[facility]   CSN: [id number] Arrival date & time: 05/10/16  1551  By signing my name below, I, Katie Chaney, attest that this documentation has been prepared under the direction and in the presence of Rolland Porter, MD. Electronically Signed: Alyssa Chaney, ED Scribe. 05/10/16. 6:27 PM.   History   Chief Complaint Chief Complaint  Patient presents with  . Spasms   The history is provided by the patient. No language interpreter was used.    HPI Comments: Katie Chaney is a 45 y.o. female who presents to the Emergency Department complaining of gradual onset, persistent, right lower eye muscle spasms for 2 days. She reports associated sensation of right eye "bloating" and difficulty sleeping. Pt had x2 nights of difficulty sleeping 4 days ago and states on normal nights, she does not have trouble sleeping. She notes several caffeine and coffee drinks daily . 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 Pt recently finished antibiotics for kidney infection and states she has a follow up appointment for results. She does not take any regular medications and denies new medications.   Past Medical History:  Diagnosis Date  . Amenorrhea 08/10/2012  . Anxiety   . Chronic chest pain   . Depression   . Headache(784.0)   . Hx of echocardiogram 07/2012   normal  . Palpitations 09/2012   30 day monitor with NSR/rare PAC's  . Seizures [facility])     Patient Active Problem List   Diagnosis Date Noted  . Early menopause 10/09/2012  . Amenorrhea 08/10/2012  . Palpitations 07/23/2012  . Seizures (HCC)   . Anxiety   . Chest pain     Past Surgical History:  Procedure Laterality Date  . DILATION AND CURETTAGE OF UTERUS      OB History    Gravida Para Term Preterm AB Living   3 1     2 1    SAB TAB Ectopic Multiple Live Births   2               Home Medications    Prior to Admission medications   Not on File    Family History Family  History  Problem Relation Age of Onset  . Heart disease Mother     Enlarged heart  . Hypertension Brother   . Asthma Son     Social History Social History  Substance Use Topics  . Smoking status: Never Smoker  . Smokeless tobacco: Never Used  . Alcohol use Yes     Comment: Rarely     Allergies   Patient has no known allergies.   Review of Systems Review of Systems  Constitutional: Negative for appetite change, chills, diaphoresis, fatigue and fever.  HENT: Negative for mouth sores, sore throat and trouble swallowing.   Eyes: Negative for visual disturbance.  Respiratory: Negative for cough, chest tightness, shortness of breath and wheezing.   Cardiovascular: Negative for chest pain.  Gastrointestinal: Negative for abdominal distention, abdominal pain, diarrhea, nausea and vomiting.  Endocrine: Negative for polydipsia, polyphagia and polyuria.  Genitourinary: Negative for dysuria, frequency and hematuria.  Musculoskeletal: Negative for gait problem.  Skin: Negative for color change, pallor and rash.  Neurological: Negative for dizziness, syncope, light-headedness and headaches.  Hematological: Does not bruise/bleed easily.  Psychiatric/Behavioral: Positive for sleep disturbance. Negative for behavioral problems and confusion.     Physical Exam Updated Vital Signs BP 133/86 (BP Location: Right Arm)   Pulse 71   Temp  99 F (37.2 C) (Oral)   Resp 18   Ht 5\' 9"  (1.753 m)   Wt 155 lb (70.3 kg)   SpO2 100%   BMI 22.89 kg/m   Physical Exam  Constitutional: She is oriented to person, place, and time. She appears well-developed and well-nourished. No distress.  HENT:  Head: Normocephalic.  Eyes: Conjunctivae are normal. Pupils are equal, round, and reactive to light. No scleral icterus.  fasciculation of right eye lower lid  Neck: Normal range of motion. Neck supple. No thyromegaly present.  Cardiovascular: Normal rate and regular rhythm.  Exam reveals no gallop and  no friction rub.   No murmur heard. Pulmonary/Chest: Effort normal and breath sounds normal. No respiratory distress. She has no wheezes. She has no rales.  Abdominal: Soft. Bowel sounds are normal. She exhibits no distension. There is no tenderness. There is no rebound.  Musculoskeletal: Normal range of motion.  Neurological: She is alert and oriented to person, place, and time.  Skin: Skin is warm and dry. No rash noted.  Psychiatric: She has a normal mood and affect. Her behavior is normal.   ED Treatments / Results  DIAGNOSTIC STUDIES: Oxygen Saturation is 100% on RA, normal by my interpretation.    Labs (all labs ordered are listed, but only abnormal results are displayed) Labs Reviewed - No data to display  EKG  EKG Interpretation None       Radiology No results found.  Procedures Procedures (including critical care time)  Medications Ordered in ED Medications - No data to display   Initial Impression / Assessment and Plan / ED Course  I have reviewed the triage vital signs and the nursing notes.  Pertinent labs & imaging results that were available during my care of the patient were reviewed by me and considered in my medical decision making (see chart for details).     Final Clinical Impressions(s) / ED Diagnoses   Final diagnoses:  Facial tic    Clear liquids. Limit caffeine. Regular sleep.  New Prescriptions New Prescriptions   No medications on file     Rolland PorterMark Yari Szeliga, MD 05/10/16 [phone]

## 2016-05-10 NOTE — ED Triage Notes (Signed)
Patient has had a muscle twitch to her right lower eye x 2 days

## 2016-05-10 NOTE — Discharge Instructions (Signed)
Usually muscle spasms of the small muscles of the face are caused or made worse by sleep deprivation or too much caffeine.  Avoid caffeine, clear liquids and increase your water intake.  Benadryl or over-the-counter medication to ensure a few normal night's sleep.

## 2016-05-10 NOTE — ED Notes (Signed)
Patient denies pain and is resting comfortably.  

## 2016-07-31 ENCOUNTER — Encounter: Payer: Self-pay | Admitting: Physician Assistant

## 2016-07-31 ENCOUNTER — Ambulatory Visit (INDEPENDENT_AMBULATORY_CARE_PROVIDER_SITE_OTHER): Payer: BLUE CROSS/BLUE SHIELD | Admitting: Physician Assistant

## 2016-07-31 VITALS — BP 112/76 | HR 79 | Temp 98.6°F | Resp 16 | Ht 67.91 in | Wt 158.4 lb

## 2016-07-31 DIAGNOSIS — J020 Streptococcal pharyngitis: Secondary | ICD-10-CM

## 2016-07-31 DIAGNOSIS — J029 Acute pharyngitis, unspecified: Secondary | ICD-10-CM

## 2016-07-31 DIAGNOSIS — R300 Dysuria: Secondary | ICD-10-CM | POA: Diagnosis not present

## 2016-07-31 LAB — POCT URINALYSIS DIP (MANUAL ENTRY)
Bilirubin, UA: NEGATIVE
Glucose, UA: NEGATIVE mg/dL
Ketones, POC UA: NEGATIVE mg/dL
Leukocytes, UA: NEGATIVE
Nitrite, UA: NEGATIVE
Protein Ur, POC: NEGATIVE mg/dL
Spec Grav, UA: <=1.005 — AB (ref 1.010–1.025)
Urobilinogen, UA: 0.2 E.U./dL
pH, UA: 6 (ref 5.0–8.0)

## 2016-07-31 LAB — POC MICROSCOPIC URINALYSIS (UMFC): Mucus: ABSENT

## 2016-07-31 LAB — POCT RAPID STREP A (OFFICE): Rapid Strep A Screen: POSITIVE — AB

## 2016-07-31 MED ORDER — AMOXICILLIN 500 MG PO CAPS: 500.0000 mg | ORAL_CAPSULE | Freq: Two times a day (BID) | ORAL | 0 refills | Status: AC

## 2016-07-31 NOTE — Patient Instructions (Addendum)
You are positive for strep throat. Please start taking your antibiotics. Take the ENTIRE COURSE, even if you start to feel better. You may take Ibuprofen or Tylenol for pain. Warm tea with honey will help your sore throat feel better.  You are negative for a urinary tract infection. Please stay well hydrated. Drink 2-3 liters of water daily.  Come back if you are not improving in 3-5 days.   We recommend that you schedule a mammogram for breast cancer screening. Typically, you do not need a referral to do this. Please contact a local imaging center to schedule your mammogram.  [facility] - [phone]  *ask for the Radiology Department [facility]) - [phone] or [phone]  [facility] - [phone] [facility] - [phone] [facility] - [phone]  *ask for the Radiology Department [facility] - [phone]) [phone]  *ask for the Radiology Department [facility] - [phone]  *ask for the Mammography Department [facility] - [phone]) [phone]    IF you received an x-ray today, you will receive an invoice from [facility]. Please contact [facility] at [phone] with questions or concerns regarding your invoice.   IF you received labwork today, you will receive an invoice from [facility]. Please contact [facility] at [phone] with questions or concerns regarding your invoice.   Our billing staff will not be able to assist you with questions regarding bills from these companies.  You will be contacted with the lab results as soon as they are available. The fastest way to get your results is to activate your My Chart account. Instructions are located on the last page of this paperwork. If you have not heard from us regarding the results in 2 weeks, please contact this office.

## 2016-07-31 NOTE — Progress Notes (Signed)
Katie Chaney  MRN: [id number] DOB: [birth year]  PCP: Darrick Grinder, [facility]  Subjective:  Pt presents to clinic for sore throat x 2 days. Endorses painful swallowing and pain that radiates to her right ear. She had a fever two days ago, however did not take her temperature.  She has been taking NyQuil - not helping much.    C/o increased urgency and burning with urination x 1.5 weeks. Denies blood in urine, back pain, flank pain, chills, abdominal pain, increased frequency.   Review of Systems  Constitutional: Positive for fever. Negative for chills, diaphoresis and fatigue.  HENT: Positive for sore throat and trouble swallowing. Negative for congestion, postnasal drip, rhinorrhea, sinus pressure and sneezing.   Respiratory: Negative for cough, chest tightness, shortness of breath and wheezing.   Cardiovascular: Negative for chest pain and palpitations.  Gastrointestinal: Negative for abdominal pain, diarrhea, nausea and vomiting.  Genitourinary: Positive for dysuria, frequency and urgency. Negative for decreased urine volume, difficulty urinating, enuresis, flank pain and hematuria.  Musculoskeletal: Negative for back pain.  Neurological: Negative for dizziness, weakness, light-headedness and headaches.    Patient Active Problem List   Diagnosis Date Noted  . Early menopause 10/09/2012  . Amenorrhea 08/10/2012  . Palpitations 07/23/2012  . Seizures (HCC)   . Anxiety   . Chest pain     No current outpatient prescriptions on file prior to visit.   No current facility-administered medications on file prior to visit.     No Known Allergies   Objective:  BP 112/76 (BP Location: Right Arm, Patient Position: Sitting, Cuff Size: Normal)   Pulse 79   Temp 98.6 F (37 C) (Oral)   Resp 16   Ht 5' 7.91" (1.725 m)   Wt 158 lb 6.4 oz (71.8 kg)   LMP 12/03/2011   SpO2 99%   BMI 24.15 kg/m   Physical Exam  Constitutional: She is oriented to person, place,  and time and well-developed, well-nourished, and in no distress. No distress.  HENT:  Right Ear: Tympanic membrane is bulging.  Left Ear: Tympanic membrane normal.  Mouth/Throat: Mucous membranes are normal. Oropharyngeal exudate, posterior oropharyngeal edema and posterior oropharyngeal erythema present. No tonsillar abscesses.  Cardiovascular: Normal rate, regular rhythm and normal heart sounds.   Abdominal: There is no CVA tenderness.  Neurological: She is alert and oriented to person, place, and time. GCS score is 15.  Skin: Skin is warm and dry.  Psychiatric: Mood, memory, affect and judgment normal.  Vitals reviewed.   Results for orders placed or performed in visit on 07/31/16  POCT rapid strep A  Result Value Ref Range   Rapid Strep A Screen Positive (A) Negative  POCT Microscopic Urinalysis ([facility])  Result Value Ref Range   WBC,UR,HPF,POC None None WBC/hpf   RBC,UR,HPF,POC None None RBC/hpf   Bacteria None None, Too numerous to count   Mucus Absent Absent   Epithelial Cells, UR Per Microscopy Few (A) None, Too numerous to count cells/hpf  POCT urinalysis dipstick  Result Value Ref Range   Color, UA yellow yellow   Clarity, UA clear clear   Glucose, UA negative negative mg/dL   Bilirubin, UA negative negative   Ketones, POC UA negative negative mg/dL   Spec Grav, UA <=4.098 (A) 1.010 - 1.025   Blood, UA trace-intact (A) negative   pH, UA 6.0 5.0 - 8.0   Protein Ur, POC negative negative mg/dL   Urobilinogen, UA 0.2 0.2 or 1.0 E.U./dL   Nitrite, UA  Negative Negative   Leukocytes, UA Negative Negative    Assessment and Plan :  1. Streptococcal sore throat 2. Sore throat - amoxicillin (AMOXIL) 500 MG capsule; Take 1 capsule (500 mg total) by mouth 2 (two) times daily.  Dispense: 20 capsule; Refill: 0 - POCT rapid strep A - Encouraged supportive care: push fluids, ibuprofen/Tylenol for pain. RTC in 3-5 days if no improvement.  3. Dysuria - Urine culture - POCT  Microscopic Urinalysis ([facility]) - POCT urinalysis dipstick - Negative for UTI. Culture is pending. Will contact if positive. Push fluids.   Marco CollieWhitney Eliazer Hemphill, PA-C  Primary [facility] at [facility] 07/31/2016 8:56 AM

## 2016-08-01 LAB — URINE CULTURE

## 2017-01-19 ENCOUNTER — Other Ambulatory Visit: Payer: Self-pay

## 2017-01-19 ENCOUNTER — Encounter (HOSPITAL_BASED_OUTPATIENT_CLINIC_OR_DEPARTMENT_OTHER): Payer: Self-pay | Admitting: Emergency Medicine

## 2017-01-19 ENCOUNTER — Emergency Department (HOSPITAL_BASED_OUTPATIENT_CLINIC_OR_DEPARTMENT_OTHER): Payer: Self-pay

## 2017-01-19 ENCOUNTER — Emergency Department (HOSPITAL_BASED_OUTPATIENT_CLINIC_OR_DEPARTMENT_OTHER)
Admission: EM | Admit: 2017-01-19 | Discharge: 2017-01-19 | Disposition: A | Discharge status: Home / Self Care | Payer: Self-pay | Attending: Emergency Medicine | Admitting: Emergency Medicine

## 2017-01-19 DIAGNOSIS — F419 Anxiety disorder, unspecified: Secondary | ICD-10-CM | POA: Insufficient documentation

## 2017-01-19 DIAGNOSIS — R0789 Other chest pain: Secondary | ICD-10-CM | POA: Insufficient documentation

## 2017-01-19 DIAGNOSIS — Z79899 Other long term (current) drug therapy: Secondary | ICD-10-CM | POA: Insufficient documentation

## 2017-01-19 LAB — CBC WITH DIFFERENTIAL/PLATELET
Basophils Absolute: 0 10*3/uL (ref 0.0–0.1)
Basophils Relative: 0 %
Eosinophils Absolute: 0.1 10*3/uL (ref 0.0–0.7)
Eosinophils Relative: 2 %
HCT: 39.7 % (ref 36.0–46.0)
Hemoglobin: 13.2 g/dL (ref 12.0–15.0)
Lymphocytes Relative: 38 %
Lymphs Abs: 2.1 10*3/uL (ref 0.7–4.0)
MCH: 30.4 pg (ref 26.0–34.0)
MCHC: 33.2 g/dL (ref 30.0–36.0)
MCV: 91.5 fL (ref 78.0–100.0)
Monocytes Absolute: 0.4 10*3/uL (ref 0.1–1.0)
Monocytes Relative: 8 %
Neutro Abs: 2.8 10*3/uL (ref 1.7–7.7)
Neutrophils Relative %: 52 %
Platelets: 171 10*3/uL (ref 150–400)
RBC: 4.34 MIL/uL (ref 3.87–5.11)
RDW: 12.1 % (ref 11.5–15.5)
WBC: 5.5 10*3/uL (ref 4.0–10.5)

## 2017-01-19 LAB — BASIC METABOLIC PANEL
Anion gap: 4 — ABNORMAL LOW (ref 5–15)
BUN: 16 mg/dL (ref 6–20)
CO2: 29 mmol/L (ref 22–32)
Calcium: 9.5 mg/dL (ref 8.9–10.3)
Chloride: 104 mmol/L (ref 101–111)
Creatinine, Ser: 0.91 mg/dL (ref 0.44–1.00)
GFR calc Af Amer: >60 mL/min (ref >60)
GFR calc non Af Amer: >60 mL/min (ref >60)
Glucose, Bld: 89 mg/dL (ref 65–99)
Potassium: 4 mmol/L (ref 3.5–5.1)
Sodium: 137 mmol/L (ref 135–145)

## 2017-01-19 LAB — D-DIMER, QUANTITATIVE: D-Dimer, Quant: 0.28 ug/mL-FEU (ref 0.00–0.50)

## 2017-01-19 LAB — TROPONIN I: Troponin I: <0.03 ng/mL (ref <0.03)

## 2017-01-19 MED ORDER — KETOROLAC TROMETHAMINE 30 MG/ML IJ SOLN
30.0000 mg | Freq: Once | INTRAMUSCULAR | Status: AC
  Administered 2017-01-19: 30 mg via INTRAVENOUS
  Filled 2017-01-19: qty 1

## 2017-01-19 MED ORDER — IBUPROFEN 800 MG PO TABS: 800.0000 mg | ORAL_TABLET | Freq: Three times a day (TID) | ORAL | 0 refills | Status: AC | PRN

## 2017-01-19 NOTE — ED Notes (Signed)
Pt in X-Ray ?

## 2017-01-19 NOTE — ED Notes (Signed)
Pt on cardiac monitor and auto VS 

## 2017-01-19 NOTE — ED Notes (Signed)
ED Provider at bedside. 

## 2017-01-19 NOTE — ED Provider Notes (Signed)
[facility] EMERGENCY DEPARTMENT Provider Note   CSN: [id number] Arrival date & time: 01/19/17  1217     History   Chief Complaint Chief Complaint  Patient presents with  . Chest Pain    HPI Katie Chaney is a 46 y.o. female.  HPI Patient presents to the emergency department with 2 weeks worth of constant chest pain.  The patient states that the pain has been constant but does seem to wax and wane in its severity.  Patient states she did not take any medications prior to arrival.  The patient states that nothing seems to make the condition better or worse.  Patient states that she has had a history of chronic chest pain as well.  Patient states that she the cardiologist for this pain in the past. the patient denies  shortness of breath, headache,blurred vision, neck pain, fever, cough, weakness, numbness, dizziness, anorexia, edema, abdominal pain, nausea, vomiting, diarrhea, rash, back pain, dysuria, hematemesis, bloody stool, near syncope, or syncope. Past Medical History:  Diagnosis Date  . Amenorrhea 08/10/2012  . Anxiety   . Chronic chest pain   . Depression   . Headache(784.0)   . Hx of echocardiogram 07/2012   normal  . Left sciatic nerve pain   . Palpitations 09/2012   30 day monitor with NSR/rare PAC's  . Seizures [facility](HCC)     Patient Active Problem List   Diagnosis Date Noted  . Early menopause 10/09/2012  . Amenorrhea 08/10/2012  . Palpitations 07/23/2012  . Seizures (HCC)   . Anxiety   . Chest pain     Past Surgical History:  Procedure Laterality Date  . DILATION AND CURETTAGE OF UTERUS      OB History    Gravida Para Term Preterm AB Living   3 1     2 1    SAB TAB Ectopic Multiple Live Births   2               Home Medications    Prior to Admission medications   Medication Sig Start Date End Date Taking? Authorizing Provider  meloxicam (MOBIC) 15 MG tablet Take 15 mg by mouth as needed for pain.    [provider]  Multiple  Vitamin (MULTIVITAMIN) tablet Take 1 tablet by mouth daily.    [provider]    Family History Family History  Problem Relation Age of Onset  . Heart disease Mother        Enlarged heart  . Hypertension Brother   . Asthma Son     Social History Social History   Tobacco Use  . Smoking status: Never Smoker  . Smokeless tobacco: Never Used  Substance Use Topics  . Alcohol use: Yes    Comment: Rarely  . Drug use: No     Allergies   Patient has no known allergies.   Review of Systems Review of Systems All other systems negative except as documented in the HPI. All pertinent positives and negatives as reviewed in the HPI. Physical Exam Updated Vital Signs BP 102/65   Pulse (!) 58   Temp 98.1 F (36.7 C) (Oral)   Resp 20   Ht 5\' 9"  (1.753 m)   Wt 73.5 kg (162 lb)   LMP 12/03/2011   SpO2 100%   BMI 23.92 kg/m   Physical Exam  Constitutional: She is oriented to person, place, and time. She appears well-developed and well-nourished. No distress.  HENT:  Head: Normocephalic and atraumatic.  Mouth/Throat: Oropharynx  is clear and moist.  Eyes: Pupils are equal, round, and reactive to light.  Neck: Normal range of motion. Neck supple.  Cardiovascular: Normal rate, regular rhythm, normal heart sounds and normal pulses. Exam reveals no gallop and no friction rub.  No murmur heard. Pulmonary/Chest: Effort normal. No respiratory distress. She has no decreased breath sounds. She has no wheezes. She has no rhonchi. She has no rales.  Abdominal: Soft. Bowel sounds are normal. She exhibits no distension. There is no tenderness.  Neurological: She is alert and oriented to person, place, and time. She exhibits normal muscle tone. Coordination normal.  Skin: Skin is warm and dry. Capillary refill takes less than 2 seconds. No rash noted. No erythema.  Psychiatric: She has a normal mood and affect. Her behavior is normal.  Nursing note and vitals reviewed.    ED  Treatments / Results  Labs (all labs ordered are listed, but only abnormal results are displayed) Labs Reviewed  BASIC METABOLIC PANEL - Abnormal; Notable for the following components:      Result Value   Anion gap 4 (*)    All other components within normal limits  CBC WITH DIFFERENTIAL/PLATELET  TROPONIN I  D-DIMER, QUANTITATIVE (NOT AT [facility])    EKG  EKG Interpretation None       Radiology Dg Chest 2 View  Result Date: 01/19/2017 CLINICAL DATA:  46 year-old female c/o chest tightness x 2 weeks. EXAM: CHEST  2 VIEW COMPARISON:  Chest x-rays dated 02/20/2016 and 01/14/2012. FINDINGS: Heart size and mediastinal contours are within normal limits. Lungs are clear. No pleural effusion or pneumothorax seen. Osseous structures about the chest are unremarkable. IMPRESSION: No active cardiopulmonary disease. No evidence of pneumonia or pulmonary edema. Electronically Signed   By: Bary RichardStan  Maynard M.D.   On: 01/19/2017 15:00    Procedures Procedures (including critical care time)  Medications Ordered in ED Medications  ketorolac (TORADOL) 30 MG/ML injection 30 mg (not administered)     Initial Impression / Assessment and Plan / ED Course  I have reviewed the triage vital signs and the nursing notes.  Pertinent labs & imaging results that were available during my care of the patient were reviewed by me and considered in my medical decision making (see chart for details).    Patient's chest pain has been fairly constant and atypical for cardiac type chest pain and she also states that she has had a lot of anxiety that she feels like his attribute to this.  The patient has had multiple workups for this chest pain.  I feel the patient will need follow-up with her primary care doctor for further evaluation.  Told to return here as needed patient agrees the plan and all questions were answered.  Final Clinical Impressions(s) / ED Diagnoses   Final diagnoses:  None    ED Discharge  Orders    None       Charlestine NightLawyer, Treylan Mcclintock, PA-C 01/19/17 1555    Shaune PollackIsaacs, Cameron, MD 01/20/17 [phone]

## 2017-01-19 NOTE — ED Triage Notes (Signed)
Pt c/o middle CP x 2 wks, describes as tight; sts thinks stress related

## 2017-01-19 NOTE — Discharge Instructions (Signed)
Return here as needed.  Follow-up with your primary doctor. °

## 2017-01-19 NOTE — ED Notes (Signed)
States 2 week onset of chest tightness then sob and then eyelids twitch.  No nausea or vomiting and no pain at present

## 2017-05-16 ENCOUNTER — Other Ambulatory Visit: Payer: Self-pay

## 2017-05-16 ENCOUNTER — Emergency Department
Admission: EM | Admit: 2017-05-16 | Discharge: 2017-05-16 | Disposition: A | Discharge status: Home / Self Care | Payer: 59 | Source: Home / Self Care | Attending: Family Medicine | Admitting: Family Medicine

## 2017-05-16 DIAGNOSIS — M940 Chondrocostal junction syndrome [Tietze]: Secondary | ICD-10-CM | POA: Diagnosis not present

## 2017-05-16 MED ORDER — KETOROLAC TROMETHAMINE 60 MG/2ML IM SOLN
60.0000 mg | Freq: Once | INTRAMUSCULAR | Status: AC
  Administered 2017-05-16: 60 mg via INTRAMUSCULAR

## 2017-05-16 MED ORDER — PREDNISONE 20 MG PO TABS: ORAL_TABLET | ORAL | 0 refills | Status: DC

## 2017-05-16 NOTE — ED Triage Notes (Signed)
Pt has had chest pain for several months.  Saw MD 3-4 weeks ago and was put on Celaxa about 3 weeks ago.  Yesterday she called MD and was referred to cardiologist.  Is having numbness on right side of face. And pain in the right side of her head.

## 2017-05-16 NOTE — ED Provider Notes (Signed)
[facility]    CSN: [id number] Arrival date & time: 05/16/17  1101     History   Chief Complaint Chief Complaint  Patient presents with  . Chest Pain    HPI Katie Chaney is a 47 y.o. female.   Patient reports onset of palpitations last night associated with substernal chest pressure.  She also complains of sensation of shortness of breath with tingling in her fingers, lips, and face.  She is not having palpitations at present.  Review of records reveals that patient has had numerous ER and MD office visits in the past for chest pain and palpitations, always with negative evaluations.  She had a negative cardiac workup in the past including negative echocardiogram and event monitor.    The history is provided by the patient.    Past Medical History:  Diagnosis Date  . Amenorrhea 08/10/2012  . Anxiety   . Chronic chest pain   . Depression   . Headache(784.0)   . Hx of echocardiogram 07/2012   normal  . Left sciatic nerve pain   . Palpitations 09/2012   30 day monitor with NSR/rare PAC's  . Seizures [facility])     Patient Active Problem List   Diagnosis Date Noted  . Early menopause 10/09/2012  . Amenorrhea 08/10/2012  . Palpitations 07/23/2012  . Seizures (HCC)   . Anxiety   . Chest pain     Past Surgical History:  Procedure Laterality Date  . DILATION AND CURETTAGE OF UTERUS      OB History    Gravida Para Term Preterm AB Living   3 1     2 1    SAB TAB Ectopic Multiple Live Births   2               Home Medications    Prior to Admission medications   Medication Sig Start Date End Date Taking? Authorizing Provider  citalopram (CELEXA) 20 MG tablet Take 20 mg by mouth daily.   Yes [provider]  ibuprofen (ADVIL,MOTRIN) 800 MG tablet Take 1 tablet (800 mg total) every 8 (eight) hours as needed by mouth. 01/19/17   Lawyer, Cristal Deer, PA-C  meloxicam (MOBIC) 15 MG tablet Take 15 mg by mouth as needed for pain.    [provider]  Multiple Vitamin (MULTIVITAMIN) tablet Take 1 tablet by mouth daily.    [provider]  predniSONE (DELTASONE) 20 MG tablet Take one tab by mouth twice daily for 4 days, then one daily for 3 days. Take with food. 05/16/17   Lattie Haw, MD    Family History Family History  Problem Relation Age of Onset  . Heart disease Mother        Enlarged heart  . Hypertension Brother   . Asthma Son     Social History Social History   Tobacco Use  . Smoking status: Never Smoker  . Smokeless tobacco: Never Used  Substance Use Topics  . Alcohol use: Yes    Comment: Rarely  . Drug use: No     Allergies   Patient has no known allergies.   Review of Systems Review of Systems  Constitutional: Negative for activity change, appetite change, chills, diaphoresis, fatigue and fever.  HENT: Negative.   Eyes: Negative.   Respiratory: Positive for chest tightness and shortness of breath. Negative for cough and wheezing.   Cardiovascular: Positive for chest pain and palpitations. Negative for leg swelling.  Gastrointestinal: Negative.   Genitourinary: Negative.  Musculoskeletal: Negative.   Skin: Negative.   Neurological: Positive for numbness and headaches. Negative for tremors, facial asymmetry, speech difficulty and light-headedness.     Physical Exam Triage Vital Signs ED Triage Vitals  Enc Vitals Group     BP 05/16/17 1132 121/81     Pulse Rate 05/16/17 1132 61     Resp --      Temp 05/16/17 1132 98.1 F (36.7 C)     Temp Source 05/16/17 1132 Oral     SpO2 05/16/17 1132 (!) 60 %     Weight 05/16/17 1133 152 lb (68.9 kg)     Height 05/16/17 1133 5\' 9"  (1.753 m)     Head Circumference --      Peak Flow --      Pain Score 05/16/17 1133 5     Pain Loc --      Pain Edu? --      Excl. in GC? --    No data found.  Updated Vital Signs BP 121/81 (BP Location: Right Arm)   Pulse 61   Temp 98.1 F (36.7 C) (Oral)   Ht 5\' 9"  (1.753 m)   Wt 152 lb  (68.9 kg)   LMP 12/03/2011   SpO2 (!) 60%   BMI 22.45 kg/m   Visual Acuity Right Eye Distance:   Left Eye Distance:   Bilateral Distance:    Right Eye Near:   Left Eye Near:    Bilateral Near:     Physical Exam  Pulmonary/Chest:  Chest:  Distinct tenderness to palpation over the superior and mid-sternum.  Palpation there recreates her pain.    Nursing notes and Vital Signs reviewed. Appearance:  Patient appears stated age, and in no acute distress.  She is alert and oriented.  Eyes:  Pupils are equal, round, and reactive to light and accomodation.  Extraocular movement is intact.  Conjunctivae are not inflamed  Ears:  Canals normal.  Tympanic membranes normal.  Nose:   Normal turbinates.  No sinus tenderness.   Pharynx:  Normal Neck:  Supple.  No adenopathy.    Lungs:  Clear to auscultation.  Breath sounds are equal.  Moving air well. Heart:  Regular rate and rhythm without murmurs, rubs, or gallops.  Chest:  Distinct tenderness to palpation over the mid-sternum (see diagram above). Abdomen:  Nontender without masses or hepatosplenomegaly.  Bowel sounds are present.  No CVA or flank tenderness.  Extremities:  No edema or lower leg tenderness. Skin:  No rash present.   Neurologic:  Cranial nerves 2 through 12 are normal.  Patellar reflexes are normal.   UC Treatments / Results  Labs (all labs ordered are listed, but only abnormal results are displayed) Labs Reviewed - No data to display  EKG  EKG Interpretation  Rate:  60 BPM PR:  172 msec QT:  428 msec QTcH:  428 msec QRSD:  76 msec QRS axis:  27 degrees Interpretation:  Normal sinus rhythm; no acute changes.      Review of records:  Today's EKG similar to previous normal EKG performed 19 January 2017  Radiology No results found.  Procedures Procedures (including critical care time)  Medications Ordered in UC Medications  ketorolac (TORADOL) injection 60 mg (not administered)     Initial Impression  / Assessment and Plan / UC Course  I have reviewed the triage vital signs and the nursing notes.  Pertinent labs & imaging results that were available during my care of the patient were  reviewed by me and considered in my medical decision making (see chart for details).    EKG today shows no acute changes, and similar to previous EKG 19 January 2017. Patient has had multiple evaluations in the past for similar symptoms.  She has also had negative cardiology evaluation. Suspect combination of anxiety and hyperventilation exacerbating her condition. Administered Toradol 60mg  IM.  Begin prednisone burst/taper. Reassurance. Will refer to Sports Medicine for further treatment; consider trigger poin injections.    Final Clinical Impressions(s) / UC Diagnoses   Final diagnoses:  Costochondritis, acute    ED Discharge Orders        Ordered    predniSONE (DELTASONE) 20 MG tablet     05/16/17 1141          Lattie Haw, MD 05/19/17 1919

## 2017-05-16 NOTE — Discharge Instructions (Signed)
Apply ice pack for 20 to 30 minutes, 3 to 4 times daily  Continue until pain and swelling decrease.  °

## 2017-05-23 ENCOUNTER — Encounter: Payer: 59 | Admitting: Family Medicine

## 2017-05-23 DIAGNOSIS — Z0189 Encounter for other specified special examinations: Secondary | ICD-10-CM

## 2018-01-07 ENCOUNTER — Ambulatory Visit: Payer: 59 | Attending: Internal Medicine | Admitting: Physical Therapy

## 2019-05-28 ENCOUNTER — Other Ambulatory Visit: Payer: Self-pay | Admitting: Internal Medicine

## 2019-05-28 DIAGNOSIS — R5381 Other malaise: Secondary | ICD-10-CM

## 2019-06-02 ENCOUNTER — Other Ambulatory Visit: Payer: Self-pay | Admitting: Internal Medicine

## 2019-06-02 DIAGNOSIS — N6310 Unspecified lump in the right breast, unspecified quadrant: Secondary | ICD-10-CM

## 2020-02-27 ENCOUNTER — Encounter (HOSPITAL_BASED_OUTPATIENT_CLINIC_OR_DEPARTMENT_OTHER): Payer: Self-pay | Admitting: Emergency Medicine

## 2020-02-27 ENCOUNTER — Other Ambulatory Visit: Payer: Self-pay

## 2020-02-27 ENCOUNTER — Emergency Department (HOSPITAL_BASED_OUTPATIENT_CLINIC_OR_DEPARTMENT_OTHER)
Admission: EM | Admit: 2020-02-27 | Discharge: 2020-02-27 | Disposition: A | Discharge status: Home / Self Care | Payer: Commercial Managed Care - PPO | Attending: Emergency Medicine | Admitting: Emergency Medicine

## 2020-02-27 ENCOUNTER — Emergency Department (HOSPITAL_BASED_OUTPATIENT_CLINIC_OR_DEPARTMENT_OTHER): Payer: Commercial Managed Care - PPO

## 2020-02-27 DIAGNOSIS — R109 Unspecified abdominal pain: Secondary | ICD-10-CM | POA: Diagnosis present

## 2020-02-27 LAB — CBC WITH DIFFERENTIAL/PLATELET
Abs Immature Granulocytes: 0 10*3/uL (ref 0.00–0.07)
Basophils Absolute: 0 10*3/uL (ref 0.0–0.1)
Basophils Relative: 1 %
Eosinophils Absolute: 0 10*3/uL (ref 0.0–0.5)
Eosinophils Relative: 1 %
HCT: 41.6 % (ref 36.0–46.0)
Hemoglobin: 13.5 g/dL (ref 12.0–15.0)
Immature Granulocytes: 0 %
Lymphocytes Relative: 35 %
Lymphs Abs: 1.5 10*3/uL (ref 0.7–4.0)
MCH: 30.2 pg (ref 26.0–34.0)
MCHC: 32.5 g/dL (ref 30.0–36.0)
MCV: 93.1 fL (ref 80.0–100.0)
Monocytes Absolute: 0.4 10*3/uL (ref 0.1–1.0)
Monocytes Relative: 9 %
Neutro Abs: 2.3 10*3/uL (ref 1.7–7.7)
Neutrophils Relative %: 54 %
Platelets: 144 10*3/uL — ABNORMAL LOW (ref 150–400)
RBC: 4.47 MIL/uL (ref 3.87–5.11)
RDW: 11.7 % (ref 11.5–15.5)
WBC: 4.2 10*3/uL (ref 4.0–10.5)
nRBC: 0 % (ref 0.0–0.2)

## 2020-02-27 LAB — LIPASE, BLOOD: Lipase: 23 U/L (ref 11–51)

## 2020-02-27 LAB — URINALYSIS, ROUTINE W REFLEX MICROSCOPIC
Bilirubin Urine: NEGATIVE
Glucose, UA: NEGATIVE mg/dL
Hgb urine dipstick: NEGATIVE
Ketones, ur: NEGATIVE mg/dL
Leukocytes,Ua: NEGATIVE
Nitrite: NEGATIVE
Protein, ur: NEGATIVE mg/dL
Specific Gravity, Urine: 1.015 (ref 1.005–1.030)
pH: 6 (ref 5.0–8.0)

## 2020-02-27 LAB — COMPREHENSIVE METABOLIC PANEL
ALT: 38 U/L (ref 0–44)
AST: 40 U/L (ref 15–41)
Albumin: 4.1 g/dL (ref 3.5–5.0)
Alkaline Phosphatase: 92 U/L (ref 38–126)
Anion gap: 12 (ref 5–15)
BUN: 14 mg/dL (ref 6–20)
CO2: 25 mmol/L (ref 22–32)
Calcium: 8.8 mg/dL — ABNORMAL LOW (ref 8.9–10.3)
Chloride: 98 mmol/L (ref 98–111)
Creatinine, Ser: 0.68 mg/dL (ref 0.44–1.00)
GFR, Estimated: >60 mL/min (ref >60)
Glucose, Bld: 92 mg/dL (ref 70–99)
Potassium: 3.5 mmol/L (ref 3.5–5.1)
Sodium: 135 mmol/L (ref 135–145)
Total Bilirubin: 0.5 mg/dL (ref 0.3–1.2)
Total Protein: 7.8 g/dL (ref 6.5–8.1)

## 2020-02-27 MED ORDER — SODIUM CHLORIDE 0.9 % IV SOLN: 1000.0000 mL | INTRAVENOUS | Status: DC

## 2020-02-27 MED ORDER — SODIUM CHLORIDE 0.9 % IV BOLUS (SEPSIS)
500.0000 mL | Freq: Once | INTRAVENOUS | Status: AC
  Administered 2020-02-27: 500 mL via INTRAVENOUS

## 2020-02-27 MED ORDER — IOHEXOL 300 MG/ML  SOLN
100.0000 mL | Freq: Once | INTRAMUSCULAR | Status: AC
  Administered 2020-02-27: 100 mL via INTRAVENOUS

## 2020-02-27 MED ORDER — DICYCLOMINE HCL 20 MG PO TABS: 20.0000 mg | ORAL_TABLET | Freq: Two times a day (BID) | ORAL | 0 refills | Status: AC

## 2020-02-27 NOTE — Discharge Instructions (Addendum)
Laboratory tests and the CT scan today were normal.  Follow-up with your primary care doctor and as we discussed follow-up with a GI doctor for further evaluation.

## 2020-02-27 NOTE — ED Provider Notes (Signed)
[facility] EMERGENCY DEPARTMENT Provider Note   CSN: [id number] Arrival date & time: 02/27/20  1108     History Chief Complaint  Patient presents with  . Constipation    Katie Chaney is a 49 y.o. female.  HPI   Patient states she has been having difficulties with constipation for the last month.  Patient states she has had bowel movements but only passes a small caliber of stool and does not have a normal BM.  She states she feels bloated and full.  He is now having pain in the left side of her abdomen.  She denies any fevers or chills or vomiting.  She saw her doctor earlier this month and was given prescriptions.  Patient also has been trying over-the-counter medications.  She has taken multiple laxatives as well as enemas without relief.  Past Medical History:  Diagnosis Date  . Amenorrhea 08/10/2012  . Anxiety   . Chronic chest pain   . Depression   . Headache(784.0)   . Hx of echocardiogram 07/2012   normal  . Left sciatic nerve pain   . Palpitations 09/2012   30 day monitor with NSR/rare PAC's  . Seizures [facility])     Patient Active Problem List   Diagnosis Date Noted  . Early menopause 10/09/2012  . Amenorrhea 08/10/2012  . Palpitations 07/23/2012  . Seizures (HCC)   . Anxiety   . Chest pain     Past Surgical History:  Procedure Laterality Date  . DILATION AND CURETTAGE OF UTERUS       OB History    Gravida  3   Para  1   Term      Preterm      AB  2   Living  1     SAB  2   IAB      Ectopic      Multiple      Live Births              Family History  Problem Relation Age of Onset  . Heart disease Mother        Enlarged heart  . Hypertension Brother   . Asthma Son     Social History   Tobacco Use  . Smoking status: Never Smoker  . Smokeless tobacco: Never Used  Vaping Use  . Vaping Use: Never used  Substance Use Topics  . Alcohol use: Yes    Comment: Rarely  . Drug use: No    Home Medications Prior to  Admission medications   Medication Sig Start Date End Date Taking? Authorizing Provider  ibuprofen (ADVIL,MOTRIN) 800 MG tablet Take 1 tablet (800 mg total) every 8 (eight) hours as needed by mouth. 01/19/17  Yes Lawyer, Cristal Deer, PA-C  meloxicam (MOBIC) 15 MG tablet Take 15 mg by mouth as needed for pain.   Yes [provider]  citalopram (CELEXA) 20 MG tablet Take 20 mg by mouth daily.    [provider]  dicyclomine (BENTYL) 20 MG tablet Take 1 tablet (20 mg total) by mouth 2 (two) times daily. 02/27/20   Linwood Dibbles, MD  Multiple Vitamin (MULTIVITAMIN) tablet Take 1 tablet by mouth daily.    [provider]  predniSONE (DELTASONE) 20 MG tablet Take one tab by mouth twice daily for 4 days, then one daily for 3 days. Take with food. 05/16/17   Lattie Haw, MD    Allergies    Patient has no known allergies.  Review of  Systems   Review of Systems  All other systems reviewed and are negative.   Physical Exam Updated Vital Signs BP 99/66 (BP Location: Left Arm)   Pulse 86   Temp 98.4 F (36.9 C) (Oral)   Resp 18   Ht 1.753 m (5\' 9" )   Wt 75.7 kg   LMP 12/03/2011   SpO2 100%   BMI 24.65 kg/m   Physical Exam Vitals and nursing note reviewed.  Constitutional:      General: She is not in acute distress.    Appearance: She is well-developed and well-nourished.  HENT:     Head: Normocephalic and atraumatic.     Right Ear: External ear normal.     Left Ear: External ear normal.  Eyes:     General: No scleral icterus.       Right eye: No discharge.        Left eye: No discharge.     Conjunctiva/sclera: Conjunctivae normal.  Neck:     Trachea: No tracheal deviation.  Cardiovascular:     Rate and Rhythm: Normal rate and regular rhythm.     Pulses: Intact distal pulses.  Pulmonary:     Effort: Pulmonary effort is normal. No respiratory distress.     Breath sounds: Normal breath sounds. No stridor. No wheezing or rales.  Abdominal:      General: Bowel sounds are normal. There is no distension.     Palpations: Abdomen is soft.     Tenderness: There is abdominal tenderness in the left lower quadrant. There is no guarding or rebound.  Musculoskeletal:        General: No tenderness or edema.     Cervical back: Neck supple.  Skin:    General: Skin is warm and dry.     Findings: No rash.  Neurological:     Mental Status: She is alert.     Cranial Nerves: No cranial nerve deficit (no facial droop, extraocular movements intact, no slurred speech).     Sensory: No sensory deficit.     Motor: No abnormal muscle tone or seizure activity.     Coordination: Coordination normal.     Deep Tendon Reflexes: Strength normal.  Psychiatric:        Mood and Affect: Mood and affect normal.     ED Results / Procedures / Treatments   Labs (all labs ordered are listed, but only abnormal results are displayed) Labs Reviewed  CBC WITH DIFFERENTIAL/PLATELET - Abnormal; Notable for the following components:      Result Value   Platelets 144 (*)    All other components within normal limits  COMPREHENSIVE METABOLIC PANEL - Abnormal; Notable for the following components:   Calcium 8.8 (*)    All other components within normal limits  URINALYSIS, ROUTINE W REFLEX MICROSCOPIC  LIPASE, BLOOD    Radiology CT ABDOMEN PELVIS W CONTRAST  Result Date: 02/27/2020 CLINICAL DATA:  Constipation, abdominal pain, diverticulitis suspected EXAM: CT ABDOMEN AND PELVIS WITH CONTRAST TECHNIQUE: Multidetector CT imaging of the abdomen and pelvis was performed using the standard protocol following bolus administration of intravenous contrast. CONTRAST:  02/29/2020 OMNIPAQUE IOHEXOL 300 MG/ML  SOLN COMPARISON:  None. FINDINGS: Lower chest: No acute abnormality. Hepatobiliary: No solid liver abnormality is seen. No gallstones, gallbladder wall thickening, or biliary dilatation. Pancreas: Unremarkable. No pancreatic ductal dilatation or surrounding inflammatory  changes. Spleen: Normal in size without significant abnormality. Adrenals/Urinary Tract: Adrenal glands are unremarkable. Kidneys are normal, without renal calculi, solid lesion, or  hydronephrosis. Bladder is unremarkable. Stomach/Bowel: Stomach is within normal limits. Appendix appears normal. No evidence of bowel wall thickening, distention, or inflammatory changes. Vascular/Lymphatic: No significant vascular findings are present. No enlarged abdominal or pelvic lymph nodes. Reproductive: No mass or other significant abnormality. Other: No abdominal wall hernia or abnormality. No abdominopelvic ascites. Musculoskeletal: No acute or significant osseous findings. IMPRESSION: 1.  No acute CT findings of the abdomen or pelvis to explain pain. 2.  No significant diverticular disease. 3.  No large burden of stool in the colon. Electronically Signed   By: Lauralyn Primes M.D.   On: 02/27/2020 14:02    Procedures Procedures (including critical care time)  Medications Ordered in ED Medications  sodium chloride 0.9 % bolus 500 mL (0 mLs Intravenous Stopped 02/27/20 1513)    Followed by  0.9 %  sodium chloride infusion ( Intravenous Restarted 02/27/20 1513)  iohexol (OMNIPAQUE) 300 MG/ML solution 100 mL (100 mLs Intravenous Contrast Given 02/27/20 1328)    ED Course  I have reviewed the triage vital signs and the nursing notes.  Pertinent labs & imaging results that were available during my care of the patient were reviewed by me and considered in my medical decision making (see chart for details).  Clinical Course as of 02/27/20 1527  Sun Feb 27, 2020  1248 CBC is normal. [JK]    Clinical Course User Index [JK] Linwood Dibbles, MD   MDM Rules/Calculators/A&P                         Patient presented to the ED for evaluation of constipation for 1 month.  Patient has tried multiple over-the-counter medications.  Patient was concerned about decreased stool caliber.  On exam patient was having left-sided  abdominal pain.  I was concerned about the possibility of diverticulitis or colitis.  Her laboratory tests are unremarkable.  CT scan does not show any evidence of diverticulitis or colitis.  There is also no evidence of mass or obstructing lesion.  I do think the patient would benefit from further evaluation such as outpatient colonoscopy.  Symptoms may be related to IBS but with like to make sure she does not have any malignancy.. We will plan on discharge home with outpatient referral. Final Clinical Impression(s) / ED Diagnoses Final diagnoses:  Abdominal pain, unspecified abdominal location    Rx / DC Orders ED Discharge Orders         Ordered    dicyclomine (BENTYL) 20 MG tablet  2 times daily        02/27/20 1523           Linwood Dibbles, MD 02/27/20 1527

## 2020-02-27 NOTE — ED Triage Notes (Addendum)
Pt c/o constipation x 1 month. Pt has tried over the counter medications without relief.

## 2020-02-27 NOTE — ED Notes (Signed)
ED Provider at bedside. 

## 2020-10-30 ENCOUNTER — Other Ambulatory Visit: Payer: Self-pay

## 2020-10-30 ENCOUNTER — Ambulatory Visit (INDEPENDENT_AMBULATORY_CARE_PROVIDER_SITE_OTHER): Payer: Self-pay | Admitting: Emergency Medicine

## 2020-10-30 ENCOUNTER — Encounter: Payer: Self-pay | Admitting: Emergency Medicine

## 2020-10-30 VITALS — BP 120/80 | HR 69 | Temp 98.4°F | Ht 69.0 in | Wt 163.0 lb

## 2020-10-30 DIAGNOSIS — Z1321 Encounter for screening for nutritional disorder: Secondary | ICD-10-CM

## 2020-10-30 DIAGNOSIS — G8929 Other chronic pain: Secondary | ICD-10-CM

## 2020-10-30 DIAGNOSIS — Z113 Encounter for screening for infections with a predominantly sexual mode of transmission: Secondary | ICD-10-CM

## 2020-10-30 DIAGNOSIS — F418 Other specified anxiety disorders: Secondary | ICD-10-CM

## 2020-10-30 DIAGNOSIS — Z87898 Personal history of other specified conditions: Secondary | ICD-10-CM

## 2020-10-30 DIAGNOSIS — Z13220 Encounter for screening for lipoid disorders: Secondary | ICD-10-CM

## 2020-10-30 DIAGNOSIS — M25512 Pain in left shoulder: Secondary | ICD-10-CM

## 2020-10-30 DIAGNOSIS — Z13228 Encounter for screening for other metabolic disorders: Secondary | ICD-10-CM

## 2020-10-30 DIAGNOSIS — Z130 Encounter for screening for diseases of the blood and blood-forming organs and certain disorders involving the immune mechanism: Secondary | ICD-10-CM

## 2020-10-30 DIAGNOSIS — S46812A Strain of other muscles, fascia and tendons at shoulder and upper arm level, left arm, initial encounter: Secondary | ICD-10-CM

## 2020-10-30 DIAGNOSIS — Z7689 Persons encountering health services in other specified circumstances: Secondary | ICD-10-CM

## 2020-10-30 DIAGNOSIS — Z1329 Encounter for screening for other suspected endocrine disorder: Secondary | ICD-10-CM

## 2020-10-30 NOTE — Progress Notes (Signed)
Katie Chaney 50 y.o.   Chief Complaint  Patient presents with   New Patient (Initial Visit)    Physical check up. Pt states she has shoulder pain left side that comes and goes.    HISTORY OF PRESENT ILLNESS: This is a 50 y.o. female first visit to this office, here to establish care with me. Past medical history of seizures when she was a kid.  Not on medication. Has history of intermittent anxiety episodes.  On Celexa 20 mg on and off.  Does not really want to take this medication anymore. Sees therapist on a regular basis. Today complaining of chronic left shoulder discomfort.  Intermittent pain worse with movement, better with rest.  Denies injuries. No other complaints or medical concerns. Married with 449 year old son.  [place] Has 2 very high stressors at present time: Divorce and job dissatisfaction.  HPI   Prior to Admission medications   Medication Sig Start Date End Date Taking? Authorizing Provider  citalopram (CELEXA) 20 MG tablet Take 20 mg by mouth daily.   Yes [provider]  dicyclomine (BENTYL) 20 MG tablet Take 1 tablet (20 mg total) by mouth 2 (two) times daily. 02/27/20  Yes Linwood DibblesKnapp, Jon, MD  ibuprofen (ADVIL,MOTRIN) 800 MG tablet Take 1 tablet (800 mg total) every 8 (eight) hours as needed by mouth. 01/19/17  Yes Lawyer, Cristal Deerhristopher, PA-C  meloxicam (MOBIC) 15 MG tablet Take 15 mg by mouth as needed for pain.   Yes [provider]  Multiple Vitamin (MULTIVITAMIN) tablet Take 1 tablet by mouth daily.   Yes [provider]  predniSONE (DELTASONE) 20 MG tablet Take one tab by mouth twice daily for 4 days, then one daily for 3 days. Take with food. 05/16/17  Yes Lattie HawBeese, Stephen A, MD    No Known Allergies  Patient Active Problem List   Diagnosis Date Noted   Early menopause 10/09/2012   Amenorrhea 08/10/2012   Seizures (HCC)    Anxiety     Past Medical History:  Diagnosis Date   Amenorrhea 08/10/2012   Anxiety     Chronic chest pain    Depression    Headache(784.0)    Hx of echocardiogram 07/2012   normal   Left sciatic nerve pain    Palpitations 09/2012   30 day monitor with NSR/rare PAC's   Seizures (HCC)     Past Surgical History:  Procedure Laterality Date   DILATION AND CURETTAGE OF UTERUS      Social History   Socioeconomic History   Marital status: Married    Spouse name: Not on file   Number of children: 1   Years of education: Not on file   Highest education level: Not on file  Occupational History    Employer: [employer]  Tobacco Use   Smoking status: Never   Smokeless tobacco: Never  Vaping Use   Vaping Use: Never used  Substance and Sexual Activity   Alcohol use: Yes    Comment: Rarely   Drug use: No   Sexual activity: Yes    Birth control/protection: None  Other Topics Concern   Not on file  Social History Narrative   Not on file   Social Determinants of Health   Financial Resource Strain: Not on file  Food Insecurity: Not on file  Transportation Needs: Not on file  Physical Activity: Not on file  Stress: Not on file  Social Connections: Not on file  Intimate Partner Violence: Not on file  Family History  Problem Relation Age of Onset   Heart disease Mother        Enlarged heart   Hypertension Brother    Asthma Son      Review of Systems  Constitutional: Negative.  Negative for chills and fever.  HENT: Negative.  Negative for congestion and sore throat.   Respiratory: Negative.  Negative for cough and shortness of breath.   Cardiovascular: Negative.  Negative for chest pain and palpitations.  Gastrointestinal:  Negative for abdominal pain, diarrhea, nausea and vomiting.  Genitourinary: Negative.  Negative for dysuria and hematuria.  Skin: Negative.  Negative for rash.  Neurological: Negative.  Negative for dizziness and headaches.  All other systems reviewed and are negative.  Today's Vitals   10/30/20 1059  BP:  120/80  Pulse: 69  Temp: 98.4 F (36.9 C)  TempSrc: Oral  SpO2: 99%  Weight: 163 lb (73.9 kg)  Height: 5\' 9"  (1.753 m)   Body mass index is 24.07 kg/m.  Physical Exam Vitals reviewed.  Constitutional:      Appearance: Normal appearance.  HENT:     Head: Normocephalic.  Eyes:     Extraocular Movements: Extraocular movements intact.     Conjunctiva/sclera: Conjunctivae normal.     Pupils: Pupils are equal, round, and reactive to light.  Cardiovascular:     Rate and Rhythm: Normal rate and regular rhythm.     Pulses: Normal pulses.     Heart sounds: Normal heart sounds.  Pulmonary:     Effort: Pulmonary effort is normal.     Breath sounds: Normal breath sounds.  Musculoskeletal:     Cervical back: Normal range of motion and neck supple. No tenderness.     Comments: Left shoulder: Full range of motion but complaining of some pain. Positive tenderness and spasm to left trapezius muscle. Rest of left upper extremity: Within normal limits.  Lymphadenopathy:     Cervical: No cervical adenopathy.  Skin:    General: Skin is warm and dry.     Capillary Refill: Capillary refill takes less than 2 seconds.  Neurological:     General: No focal deficit present.     Mental Status: She is alert and oriented to person, place, and time.  Psychiatric:        Mood and Affect: Mood normal.        Behavior: Behavior normal.     ASSESSMENT & PLAN: Katie Chaney was seen today for new patient (initial visit).  Diagnoses and all orders for this visit:  Chronic left shoulder pain -     Ambulatory referral to Sports Medicine  Encounter to establish care  History of seizures  Situational anxiety  Screening for deficiency anemia -     CBC with Differential/Platelet; Future  Screening for endocrine, nutritional, metabolic and immunity disorder -     Comprehensive metabolic panel; Future -     Hemoglobin A1c; Future -     TSH; Future  Screening for lipoid disorders -     Lipid panel;  Future  Screening for STD (sexually transmitted disease) -     Hepatitis C antibody -     HIV Antibody (routine testing w rflx) -     RPR  Strain of left trapezius muscle, initial encounter  Chronic left shoulder pain Muscle tenderness and spasm on physical exam.  Painful range of motion.  May benefit from physical therapy.  Will refer to sports medicine for further evaluation. Advise regarding use of high-dose ibuprofen and  or meloxicam given.  Use only as needed.  History of seizures Stable.  No recent seizure activity.  Not on medications.  Situational anxiety Affecting quality of life.  High stressors identified as triggers.  Stress management advice given.  She will continue to follow-up with her therapist as scheduled on a regular basis.  Patient Instructions  Mantenimiento de Radiographer, therapeutic en las mujeres Health Maintenance, Female Adoptar un estilo de vida saludable y recibir atencin preventiva son importantes para promover la salud y Counsellor. Consulte al mdico sobre: El esquema adecuado para hacerse pruebas y exmenes peridicos. Cosas que puede hacer por su cuenta para prevenir enfermedades y [employer]. Qu debo saber sobre la dieta, el peso y el ejercicio? Consuma una dieta saludable  Consuma una dieta que incluya muchas verduras, frutas, productos lcteos con bajo contenido de [place] y Associate Professor. No consuma muchos alimentos ricos en grasas slidas, azcares agregados o sodio.  Mantenga un peso saludable El ndice de masa muscular [facility]) se [place] para identificar problemas de [place]. Proporciona una estimacin de la grasa corporal basndose en el peso y la altura. Su mdico puede ayudarle a Engineer, site IMC y a Personnel officer o Pharmacologist unpeso saludable. Haga ejercicio con regularidad Haga ejercicio con regularidad. Esta es una de las prcticas ms importantes que puede hacer por su salud. La [employer] de los adultos deben seguir estas pautas: Education officer, environmental, al menos, 150  minutos de actividad fsica por semana. El ejercicio debe aumentar la frecuencia cardaca y Media planner transpirar (ejercicio de intensidad moderada). Hacer ejercicios de fortalecimiento por lo [employer] por semana. Agregue esto a su plan de ejercicio de intensidad moderada. Pasar menos tiempo sentados. Incluso la actividad fsica ligera puede ser beneficiosa. Controle sus niveles de colesterol y lpidos en la sangre Comience a realizarse anlisis de lpidos y colesterol en la sangre a los20 aos y luego reptalos cada 5 aos. Hgase controlar los niveles de colesterol con mayor frecuencia si: Sus niveles de lpidos y colesterol son altos. Es mayor de 40 aos. Presenta un alto riesgo de padecer enfermedades cardacas. Qu debo saber sobre las pruebas de deteccin del cncer? Segn su historia clnica y sus antecedentes familiares, es posible que deba realizarse pruebas de deteccin del cncer en diferentes edades. Esto puede incluir pruebas de deteccin de lo siguiente: Cncer de mama. Cncer de cuello uterino. Cncer colorrectal. Cncer de piel. Cncer de pulmn. Qu debo saber sobre la enfermedad cardaca, la diabetes y la hipertensinarterial? Presin arterial y enfermedad cardaca La hipertensin arterial causa enfermedades cardacas y [place] el riesgo de accidente cerebrovascular. Es ms probable que esto se manifieste en las personas que tienen lecturas de presin arterial alta, tienen ascendencia africana o tienen sobrepeso. Hgase controlar la presin arterial: Cada 3 a 5 aos si tiene entre 18 y 36 aos. Todos los aos si es mayor de 40 aos. Diabetes Realcese exmenes de deteccin de la diabetes con regularidad. Este anlisis revisa el nivel de azcar en la sangre en [place]. Hgase las pruebas de deteccin: Cada tres aos despus de los 40 aos de edad si tiene un peso normal y un bajo riesgo de padecer diabetes. Con ms frecuencia y a partir de [place] edad inferior si tiene  sobrepeso o un alto riesgo de padecer diabetes. Qu debo saber sobre la prevencin de infecciones? Hepatitis B Si tiene un riesgo ms alto de contraer hepatitis B, debe someterse a un examen de deteccin de este virus. Hable con el mdico para averiguar si tiene riesgode  contraer la infeccin por hepatitis B. Hepatitis C Se recomienda el anlisis a: [employer] 1945 y 1965. Todas las personas que tengan un riesgo de haber contrado hepatitis C. Enfermedades de transmisin sexual (ETS) Hgase las pruebas de Airline pilot de ITS, incluidas la gonorrea y la clamidia, si: Es sexualmente activa y es menor de [address]. Es mayor de [address], y Public affairs consultant informa que corre riesgo de tener este tipo de infecciones. La actividad sexual ha cambiado desde que le hicieron la ltima prueba de deteccin y tiene un riesgo mayor de Warehouse manager clamidia o Copy. Pregntele al mdico si usted tiene riesgo. Pregntele al mdico si usted tiene un alto riesgo de Primary school teacher VIH. El mdico tambin puede recomendarle un medicamento recetado para ayudar a evitar la infeccin por el VIH. Si elige tomar medicamentos para prevenir el VIH, primero debe [employer] de deteccin del VIH. Luego debe hacerse anlisis cada 3 meses mientras est tomando los medicamentos. Embarazo Si est por dejar de Armed forces training and education officer (fase premenopusica) y usted puede quedar [place], busque asesoramiento antes de [place]. [place] de 400 a 800 microgramos (mcg) de cido [employer] si [place]. Pida mtodos de control de la natalidad (anticonceptivos) si desea evitar un embarazo no deseado. Osteoporosis y [place] La osteoporosis es una enfermedad en la que los huesos pierden los minerales y la fuerza por el avance de la edad. El resultado pueden ser fracturas en los [place]. Si tiene 65 aos o ms, o si est en riesgo de sufrir osteoporosis y fracturas, pregunte a su mdico si debe: Hacerse pruebas de  deteccin de prdida sea. Tomar un suplemento de calcio o de vitamina D para reducir el riesgo de fracturas. Recibir terapia de reemplazo hormonal (TRH) para tratar los sntomas de la menopausia. Siga estas instrucciones en su casa: Estilo de vida No consuma ningn producto que contenga nicotina o tabaco, como cigarrillos, cigarrillos electrnicos y tabaco de Theatre manager. Si necesita ayuda para dejar de fumar, consulte al mdico. No consuma drogas. No comparta agujas. Solicite ayuda a su mdico si necesita apoyo o informacin para abandonar las drogas. Consumo de alcohol No beba alcohol si: Su mdico le indica no hacerlo. Est embarazada, puede estar embarazada o est tratando de [place]. Si bebe alcohol: Limite la cantidad que consume de 0 a 1 medida por da. Limite la ingesta si est amamantando. Est atento a la cantidad de alcohol que hay en las bebidas que toma. En los [address], una medida equivale a una botella de cerveza de 12 oz (355 ml), un vaso de vino de 5 oz (148 ml) o un vaso de una bebida alcohlica de alta graduacin de 1 oz (44 ml). Instrucciones generales Realcese los estudios de rutina de la salud, dentales y de Wellsite geologist. Mantngase al da con las vacunas. Infrmele a su mdico si: Se siente deprimida con frecuencia. Alguna vez ha sido vctima de [place] o no se siente segura en su casa. Resumen Adoptar un estilo de vida saludable y recibir atencin preventiva son importantes para promover la salud y Counsellor. Siga las instrucciones del mdico acerca de una dieta saludable, el ejercicio y la realizacin de pruebas o exmenes para Hotel manager. Siga las instrucciones del mdico con respecto al control del colesterol y la presin arterial. Esta informacin no tiene Theme park manager el consejo del mdico. Asegresede hacerle al mdico cualquier pregunta que tenga. Document Revised: 03/11/2018 Document Reviewed: 03/11/2018 Elsevier Patient  Education  2022 Weir, MD [facility]

## 2020-10-30 NOTE — Assessment & Plan Note (Signed)
Affecting quality of life.  High stressors identified as triggers.  Stress management advice given.  She will continue to follow-up with her therapist as scheduled on a regular basis.

## 2020-10-30 NOTE — Patient Instructions (Signed)
Mantenimiento de la salud en las mujeres Health Maintenance, Female Adoptar un estilo de vida saludable y recibir atencin preventiva son importantes para promover la salud y el bienestar. Consulte al mdico sobre: El esquema adecuado para hacerse pruebas y exmenes peridicos. Cosas que puede hacer por su cuenta para prevenir enfermedades y mantenerse sana. Qu debo saber sobre la dieta, el peso y el ejercicio? Consuma una dieta saludable  Consuma una dieta que incluya muchas verduras, frutas, productos lcteos con bajo contenido de grasa y protenas magras. No consuma muchos alimentos ricos en grasas slidas, azcares agregados o sodio.  Mantenga un peso saludable El ndice de masa muscular (IMC) se utiliza para identificar problemas de peso. Proporciona una estimacin de la grasa corporal basndose en el peso y la altura. Su mdico puede ayudarle a determinar su IMC y a lograr o mantener unpeso saludable. Haga ejercicio con regularidad Haga ejercicio con regularidad. Esta es una de las prcticas ms importantes que puede hacer por su salud. La mayora de los adultos deben seguir estas pautas: Realizar, al menos, 150minutos de actividad fsica por semana. El ejercicio debe aumentar la frecuencia cardaca y hacerlo transpirar (ejercicio de intensidad moderada). Hacer ejercicios de fortalecimiento por lo menos dos veces por semana. Agregue esto a su plan de ejercicio de intensidad moderada. Pasar menos tiempo sentados. Incluso la actividad fsica ligera puede ser beneficiosa. Controle sus niveles de colesterol y lpidos en la sangre Comience a realizarse anlisis de lpidos y colesterol en la sangre a los20aos y luego reptalos cada 5aos. Hgase controlar los niveles de colesterol con mayor frecuencia si: Sus niveles de lpidos y colesterol son altos. Es mayor de 40aos. Presenta un alto riesgo de padecer enfermedades cardacas. Qu debo saber sobre las pruebas de deteccin del  cncer? Segn su historia clnica y sus antecedentes familiares, es posible que deba realizarse pruebas de deteccin del cncer en diferentes edades. Esto puede incluir pruebas de deteccin de lo siguiente: Cncer de mama. Cncer de cuello uterino. Cncer colorrectal. Cncer de piel. Cncer de pulmn. Qu debo saber sobre la enfermedad cardaca, la diabetes y la hipertensinarterial? Presin arterial y enfermedad cardaca La hipertensin arterial causa enfermedades cardacas y aumenta el riesgo de accidente cerebrovascular. Es ms probable que esto se manifieste en las personas que tienen lecturas de presin arterial alta, tienen ascendencia africana o tienen sobrepeso. Hgase controlar la presin arterial: Cada 3 a 5 aos si tiene entre 18 y 39 aos. Todos los aos si es mayor de 40aos. Diabetes Realcese exmenes de deteccin de la diabetes con regularidad. Este anlisis revisa el nivel de azcar en la sangre en ayunas. Hgase las pruebas de deteccin: Cada tresaos despus de los 40aos de edad si tiene un peso normal y un bajo riesgo de padecer diabetes. Con ms frecuencia y a partir de una edad inferior si tiene sobrepeso o un alto riesgo de padecer diabetes. Qu debo saber sobre la prevencin de infecciones? Hepatitis B Si tiene un riesgo ms alto de contraer hepatitis B, debe someterse a un examen de deteccin de este virus. Hable con el mdico para averiguar si tiene riesgode contraer la infeccin por hepatitis B. Hepatitis C Se recomienda el anlisis a: Todos los que nacieron entre 1945 y 1965. Todas las personas que tengan un riesgo de haber contrado hepatitis C. Enfermedades de transmisin sexual (ETS) Hgase las pruebas de deteccin de ITS, incluidas la gonorrea y la clamidia, si: Es sexualmente activa y es menor de 24aos. Es mayor de 24aos, y el mdico   le informa que corre riesgo de tener este tipo de infecciones. La actividad sexual ha cambiado desde que le  hicieron la ltima prueba de deteccin y tiene un riesgo mayor de tener clamidia o gonorrea. Pregntele al mdico si usted tiene riesgo. Pregntele al mdico si usted tiene un alto riesgo de contraer VIH. El mdico tambin puede recomendarle un medicamento recetado para ayudar a evitar la infeccin por el VIH. Si elige tomar medicamentos para prevenir el VIH, primero debe hacerse los anlisis de deteccin del VIH. Luego debe hacerse anlisis cada 3meses mientras est tomando los medicamentos. Embarazo Si est por dejar de menstruar (fase premenopusica) y usted puede quedar embarazada, busque asesoramiento antes de quedar embarazada. Tome de 400 a 800microgramos (mcg) de cido flico todos los das si queda embarazada. Pida mtodos de control de la natalidad (anticonceptivos) si desea evitar un embarazo no deseado. Osteoporosis y menopausia La osteoporosis es una enfermedad en la que los huesos pierden los minerales y la fuerza por el avance de la edad. El resultado pueden ser fracturas en los huesos. Si tiene 65aos o ms, o si est en riesgo de sufrir osteoporosis y fracturas, pregunte a su mdico si debe: Hacerse pruebas de deteccin de prdida sea. Tomar un suplemento de calcio o de vitamina D para reducir el riesgo de fracturas. Recibir terapia de reemplazo hormonal (TRH) para tratar los sntomas de la menopausia. Siga estas instrucciones en su casa: Estilo de vida No consuma ningn producto que contenga nicotina o tabaco, como cigarrillos, cigarrillos electrnicos y tabaco de mascar. Si necesita ayuda para dejar de fumar, consulte al mdico. No consuma drogas. No comparta agujas. Solicite ayuda a su mdico si necesita apoyo o informacin para abandonar las drogas. Consumo de alcohol No beba alcohol si: Su mdico le indica no hacerlo. Est embarazada, puede estar embarazada o est tratando de quedar embarazada. Si bebe alcohol: Limite la cantidad que consume de 0 a 1 medida por  da. Limite la ingesta si est amamantando. Est atento a la cantidad de alcohol que hay en las bebidas que toma. En los [place], una medida equivale a una botella de cerveza de 12oz (355ml), un vaso de vino de 5oz (148ml) o un vaso de una bebida alcohlica de alta graduacin de 1oz (44ml). Instrucciones generales Realcese los estudios de rutina de la salud, dentales y de la vista. Mantngase al da con las vacunas. Infrmele a su mdico si: Se siente deprimida con frecuencia. Alguna vez ha sido vctima de maltrato o no se siente segura en su casa. Resumen Adoptar un estilo de vida saludable y recibir atencin preventiva son importantes para promover la salud y el bienestar. Siga las instrucciones del mdico acerca de una dieta saludable, el ejercicio y la realizacin de pruebas o exmenes para detectar enfermedades. Siga las instrucciones del mdico con respecto al control del colesterol y la presin arterial. Esta informacin no tiene como fin reemplazar el consejo del mdico. Asegresede hacerle al mdico cualquier pregunta que tenga. Document Revised: 03/11/2018 Document Reviewed: 03/11/2018 Elsevier Patient Education  2022 Elsevier Inc.  

## 2020-10-30 NOTE — Assessment & Plan Note (Addendum)
Muscle tenderness and spasm on physical exam.  Painful range of motion.  May benefit from physical therapy.  Will refer to sports medicine for further evaluation. Advise regarding use of high-dose ibuprofen and or meloxicam given.  Use only as needed.

## 2020-10-30 NOTE — Assessment & Plan Note (Signed)
Stable.  No recent seizure activity.  Not on medications.

## 2020-11-29 NOTE — Progress Notes (Signed)
I, Katie Chaney, LAT, ATC acting as a scribe for Katie Graham, MD.  Subjective:    CC: L shoulder pain  HPI: Pt is a 50 y/o female presenting w/ chronic, intermittent L shoulder pain w no known MOI.  Pt reports pain has been ongoing for the past 5 months. She locates her pain to superior to L scapula w/ radiating pain into L-side of neck and face and into L shoulder.  She is worried about trigeminal neuralgia.  Radiating pain: yes Neck pain: yes L shoulder mechanical symptoms: yes Aggravating factors: stress, trying to sleep Treatments tried: stretching, heat,   Pertinent review of Systems: No fevers or chills  Relevant historical information: History of seizures as a child.  No history of convulsions in adulthood.   Objective:    Vitals:   11/30/20 0846  BP: 118/76  Pulse: 83  SpO2: 97%   General: Well Developed, well nourished, and in no acute distress.   MSK: C-spine normal-appearing Nontender midline. Tender palpation left trapezius and cervical paraspinal musculature. Normal cervical motion pain with cervical motion with left lateral flexion and rotation. Upper extremity strength is intact. Reflexes and sensation are intact throughout bilateral upper extremities. Negative Spurling's test.  Left shoulder normal-appearing Normal motion. Intact strength. Negative impingement testing. Tender palpation left trapezius.  Lab and Radiology Results EXAM: CT HEAD WITHOUT CONTRAST   CT CERVICAL SPINE WITHOUT CONTRAST   TECHNIQUE: Multidetector CT imaging of the head and cervical spine was performed following the standard protocol without intravenous contrast. Multiplanar CT image reconstructions of the cervical spine were also generated.   COMPARISON:  None.   FINDINGS: CT HEAD FINDINGS   No skull fracture is noted. There is mucosal thickening left maxillary sinus. The mastoid air cells are unremarkable.   No intracranial hemorrhage, mass effect or  midline shift.   No acute cortical infarction. No mass lesion is noted on this unenhanced scan. No hydrocephalus. The gray and white-matter differentiation is preserved.   CT CERVICAL SPINE FINDINGS   Axial images of the cervical spine shows no acute fracture or subluxation. There is mild reversal of cervical lordosis. Computer processed images shows no acute fracture or subluxation. Mild degenerative changes C1-C2 articulation. There is disc space flattening with mild anterior and mild posterior spurring at C3-C4 level. Mild disc space flattening with mild anterior spurring at C4-C5 level. Mild disc space flattening with anterior spurring at C5-C6 level. Calcifications of anterior longitudinal ligament are noted at C4-C5 and C5-C6 level. No prevertebral soft tissue swelling. Cervical airway is patent. There is mild degenerative sclerosis lower endplate and anterior aspect of C5 vertebral body.   IMPRESSION: 1. No acute intracranial abnormality. Mild mucosal thickening left maxillary sinus. 2. No cervical spine acute fracture or subluxation. Multilevel degenerative changes as described above.     Electronically Signed   By: Katie Chaney M.D.   On: 04/28/2014 10:11 I, Katie Chaney, personally (independently) visualized and performed the interpretation of the CT C-spine images attached in this note.     Impression and Recommendations:    Assessment and Plan: 50 y.o. female with left lateral neck pain and trapezius pain ongoing for months.  Pain thought to be muscle dysfunction and spasm.  Trigeminal neuralgia very unlikely.  Plan to treat with home exercise program, and physical therapy.  Additionally will use tizanidine and heating pad.  Tizanidine should be used mostly at bedtime because it sedating.  Recheck back in in 6 weeks or so.  Of note patient  currently does not have health insurance but will be getting health insurance in November.  Held off on x-rays now but certainly  could get x-rays sooner than November if needed.  CT C-spine in 2015 did have some degenerative changes but otherwise was unremarkable. PDMP not reviewed this encounter. Orders Placed This Encounter  Procedures   Ambulatory referral to Physical Therapy    Referral Priority:   Routine    Referral Type:   Physical Medicine    Referral Reason:   Specialty Services Required    Requested Specialty:   Physical Therapy    Number of Visits Requested:   1   Meds ordered this encounter  Medications   tiZANidine (ZANAFLEX) 4 MG tablet    Sig: Take 1 tablet (4 mg total) by mouth every 6 (six) hours as needed for muscle spasms.    Dispense:  30 tablet    Refill:  1    Discussed warning signs or symptoms. Please see discharge instructions. Patient expresses understanding.   The above documentation has been reviewed and is accurate and complete Katie Chaney, M.D.

## 2020-11-30 ENCOUNTER — Ambulatory Visit: Payer: Self-pay

## 2020-11-30 ENCOUNTER — Other Ambulatory Visit: Payer: Self-pay

## 2020-11-30 ENCOUNTER — Ambulatory Visit (INDEPENDENT_AMBULATORY_CARE_PROVIDER_SITE_OTHER): Payer: Self-pay | Admitting: Family Medicine

## 2020-11-30 VITALS — BP 118/76 | HR 83 | Ht 69.0 in | Wt 164.0 lb

## 2020-11-30 DIAGNOSIS — M25512 Pain in left shoulder: Secondary | ICD-10-CM

## 2020-11-30 DIAGNOSIS — G8929 Other chronic pain: Secondary | ICD-10-CM

## 2020-11-30 DIAGNOSIS — M542 Cervicalgia: Secondary | ICD-10-CM

## 2020-11-30 MED ORDER — TIZANIDINE HCL 4 MG PO TABS: 4.0000 mg | ORAL_TABLET | Freq: Four times a day (QID) | ORAL | 1 refills | Status: DC | PRN

## 2020-11-30 NOTE — Patient Instructions (Addendum)
Thank you for coming in today.   Please get an Xray today before you leave   I've referred you to Physical Therapy.  Let us know if you don't hear from them in one week.   Do the home exercises.   I can do the Xray in November when you get your insurance or sooner if needed.    Recheck in 6 weeks.        Neuralgia del trigmino Trigeminal Neuralgia La neuralgia del trigmino es un trastorno nervioso que causa dolor intenso en un lado de la cara. El dolor puede durar de unos segundos a varios minutos. Habitualmente, el dolor aparece en un lado de la cara. Los sntomas pueden presentarse [address], semanas o meses, y Clinical biochemist por meses o aos. El dolor puede volver y ser peor que antes. Cules son las causas? La causa de esta afeccin es el dao o la presin en un nervio de la cabeza que se llama trigmino. Las siguientes acciones pueden desencadenar una crisis: Hablar. Mastica. Maquillarse. Lavarse el rostro. Afeitarse el rostro. Cepillarse los dientes. Tocarse el rostro. Qu incrementa el riesgo? Es ms probable que tengan esta afeccin las personas que: Son [place] de 50 aos de [place]. Son [place]. Cules son los signos o los sntomas? El sntoma principal de esta afeccin es el dolor intenso en: La mandbula. Los labios. Los ojos. Olena Heckle. El cuero cabelludo. La frente. La cara. El dolor puede tener las siguientes caractersticas: Intenso. Pualadas. Similar a electricidad. Similar a un choque. Cmo se diagnostica? Esta afeccin se diagnostica mediante un examen fsico. Se puede realizar una exploracin por tomografa computarizada (TC) o una resonancia magntica (RM) para descartar la presencia de otros trastornos que pueden causar dolor facial. Cmo se trata? El tratamiento de esta afeccin puede incluir: Automotive engineer las cosas que desencadenan los sntomas. Tomar medicamentos recetados (anticonvulsivos). Someterse a [place]. Esta puede  realizarse [employer] casos son graves, si otro tratamiento mdico no brinda alivio. Someterse a procedimientos como ablacin, tratamiento trmico o radioterapia. El tratamiento puede demorar hasta un mes en comenzar a Engineer, materials. Siga estas instrucciones en su casa: Control del dolor Infrmese lo ms que pueda sobre cmo Runner, broadcasting/film/video. Pregntele al mdico si sera til recurrir a Dance movement psychotherapist. Hable con un mdico especializado en salud mental (psiclogo) sobre cmo lidiar con Chief Technology Officer. Considere la posibilidad de Advertising account planner en un grupo de apoyo para Chief Technology Officer. Instrucciones generales [employer] de venta libre y los recetados solamente como se lo haya indicado el mdico. Evite las cosas que desencadenan los sntomas. Puede ser de ayuda: Masticar del lado de la boca que no est afectado. Evite tocarse el rostro. Evitar las rfagas de aire fro o caliente. Siga el plan de tratamiento como se lo haya indicado el mdico. Puede incluir: Terapia cognitiva o conductual. Hacer actividad fsica moderada y con regularidad. Yoga o meditacin. Aromaterapia. Concurra a todas las visitas de seguimiento como se lo haya indicado el mdico. Es posible que deba ser controlado rigurosamente para asegurarse de que el tratamiento le est funcionando bien. Dnde buscar ms informacin Facial Pain Association (Asociacin del Dolor Facial): fpa-support.org Comunquese con un mdico si: Los medicamentos que toma no [employer]. Los medicamentos que toma para el tratamiento le causan efectos secundarios. Le aparecen sntomas nuevos que no se pueden explicar, por ejemplo: Visin doble. Debilidad facial. Adormecimiento facial. Cambios en la audicin o el equilibrio. Se siente deprimida. Solicite ayuda inmediatamente si:  El dolor es intenso y no mejora. Tiene pensamientos suicidas. Si alguna vez siente que puede lastimarse o Physicist, medical a Economist, o tiene  pensamientos de poner fin a su vida, busque ayuda de inmediato. Puede dirigirse al servicio de emergencias ms cercano o comunicarse con: El servicio de emergencias de su localidad (911 en [place] Una lnea de asistencia al suicida y Visual merchandiser en crisis, como National Suicide Prevention Lifeline (Lnea Nacional de Prevencin del Suicidio), al [phone]. Est disponible las 24 horas del da. Resumen La neuralgia del trigmino es un trastorno nervioso que causa dolor intenso en un lado de la cara. El dolor puede durar de unos segundos a varios minutos. La causa de esta afeccin es el dao o la presin en un nervio de la cabeza que se llama trigmino. El tratamiento puede Engineer, drilling las cosas que desencadenan los sntomas, tomar medicamentos o someterse a [place] o procedimientos. El tratamiento puede demorar hasta un mes en comenzar a Engineer, materials. Evite las cosas que desencadenan los sntomas. Concurra a todas las visitas de seguimiento como se lo haya indicado el mdico. Es posible que deba ser controlado rigurosamente para asegurarse de que el tratamiento le est funcionando bien. Esta informacin no tiene Theme park manager el consejo del mdico. Asegrese de hacerle al mdico cualquier pregunta que tenga. Document Revised: 03/11/2018 Document Reviewed: 03/11/2018 Elsevier Patient Education  2022 [employer].

## 2020-12-13 ENCOUNTER — Ambulatory Visit: Payer: Self-pay | Admitting: Physical Therapy

## 2021-03-20 ENCOUNTER — Ambulatory Visit: Payer: Self-pay | Admitting: Physical Therapy

## 2021-03-21 ENCOUNTER — Ambulatory Visit: Payer: 59 | Attending: Family Medicine | Admitting: Physical Therapy

## 2021-03-21 ENCOUNTER — Other Ambulatory Visit: Payer: Self-pay

## 2021-03-21 DIAGNOSIS — M6281 Muscle weakness (generalized): Secondary | ICD-10-CM | POA: Insufficient documentation

## 2021-03-21 DIAGNOSIS — M5412 Radiculopathy, cervical region: Secondary | ICD-10-CM | POA: Diagnosis present

## 2021-03-21 NOTE — Patient Instructions (Signed)
Access Code: [email or web address] URL: [email or web address] Date: 03/21/2021 Prepared by: Oley Balm  Exercises Seated Levator Scapulae Stretch - 1 x daily - 7 x weekly - 3 sets - 10 hold Seated Upper Trapezius Stretch - 1 x daily - 7 x weekly - 3 sets - 10 hold

## 2021-03-21 NOTE — Therapy (Signed)
[facility] [address] [place], [place], [address] Phone: [phone]   Fax:  [phone]  Physical Therapy Evaluation  Patient Details  Name: Katie Chaney MRN: [id number] Date of Birth: [birth year] Referring Provider (PT): Cheron Everyorey   Encounter Date: 03/21/2021   PT End of Session - 03/21/21 1439     Visit Number 1    Number of Visits 16    Date for PT Re-Evaluation 05/16/21    PT Start Time 1416    PT Stop Time 1454    PT Time Calculation (min) 38 min    Activity Tolerance Patient tolerated treatment well;Patient limited by pain    Behavior During Therapy [facility] for tasks assessed/performed             Past Medical History:  Diagnosis Date   Amenorrhea 08/10/2012   Anxiety    Chronic chest pain    Depression    Headache(784.0)    Hx of echocardiogram 07/2012   normal   Left sciatic nerve pain    Palpitations 09/2012   30 day monitor with NSR/rare PAC's   Seizures (HCC)     Past Surgical History:  Procedure Laterality Date   DILATION AND CURETTAGE OF UTERUS      There were no vitals filed for this visit.    Subjective Assessment - 03/21/21 1317     Subjective Patient reports L upper traps pain that radiates into her L face. She does not know of a specidfic event that triggered the pain. She has multileval cerv disc space flattenting, with spurring-C3-C6.    Pertinent History seizures, anxiety    Limitations Sitting    How long can you sit comfortably? comes and goes.    Diagnostic tests multileval cerv disc space flattenting, with spurring-C3-C6    Currently in Pain? Yes    Pain Score 5     Pain Location Neck    Pain Orientation Left;Distal;Lower;Lateral    Pain Descriptors / Indicators Sharp;Shooting    Pain Type Chronic pain    Pain Radiating Towards Into L side of face.    Pain Onset More than a month ago    Pain Frequency Intermittent    Aggravating Factors  Unknown. It comes and goes iwthout  warning    Pain Relieving Factors Patch, muscle relaxer, heat.    Effect of Pain on Daily Activities She gets sharp twinges, can perform her dialy activities, but painful.                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      ROM / Strength   AROM / PROM / Strength AROM;Strength      AROM   Overall AROM Comments  BUE QROM WNL    AROM Assessment Site Cervical    Cervical Flexion 80%    Cervical Extension 90%    Cervical - Right Side Bend 60%    Cervical - Left Side Bend 60%    Cervical - Right Rotation 60%    Cervical - Left Rotation 60%      Strength   Overall Strength Comments WNL BUE      Palpation   Palpation comment L Upper Traps and cervical paraspinals tight and TTP. Trigger points within UpTraps.                        Objective measurements completed on examination: See above findings.                PT Education - 03/21/21 1438     Education Details Patient educated to POC which includes DN, STM for trigger points, stretch and strengthening. HEP    Person(s) Educated Patient    Methods Explanation;Demonstration;Handout    Comprehension Returned demonstration;Verbalized understanding              PT Short Term Goals - 03/21/21 1447       PT SHORT TERM GOAL #1   Title I with HEP    Time 4    Period Weeks     Status New    Target Date 04/18/21               PT Long Term Goals - 03/21/21 1447       PT LONG TERM GOAL #1   Title I with final HEP    Time 8    Period Weeks    Status New    Target Date 05/16/21      PT LONG TERM GOAL #2   Title Patient will demosntrate all cervical rOM of at least 805 normal measurements, with pain </+3/10    Time 8    Period Weeks    Status New    Target Date 05/16/21      PT LONG TERM GOAL #3   Title Patient will be able to work a full day with cervical pain </+3/10    Baseline 8/10    Time 8    Period Days    Status New    Target Date 05/16/21                    Plan - 03/21/21 1440     Clinical Impression Statement Patient arrives with H/O chronic L up traps and neck pain, which at times radiates into her face. She does not know of a specific incident causing the pain, reports it comes and goes, but for the past year has been getting worse. She has trigger points in her up traps on L. Therapist initiated HEP for stretching to her neck, educated paitent to possiblity of DN in conjunction with stretch and strengthening. Recommends 2x/week.    Personal Factors and Comorbidities Past/Current Experience    Examination-Activity Limitations Locomotion Level;Reach Overhead;Sleep;Lift    Examination-Participation Restrictions Cleaning;Occupation;Yard Work    Stability/Clinical Decision Making Stable/Uncomplicated    Optometrist Low    Rehab Potential Good    PT Frequency 2x / week    PT Duration 8 weeks    PT Treatment/Interventions ADLs/Self Care Home Management;Electrical Stimulation;Cryotherapy;Traction;Moist Heat;Therapeutic exercise;Therapeutic activities;Functional mobility training;Patient/family education;Dry needling;Manual techniques             Patient will benefit from  skilled therapeutic intervention in order to improve the following deficits and impairments:  Decreased range of motion, Increased muscle  spasms, Pain, Decreased activity tolerance, Improper body mechanics, Postural dysfunction, Decreased strength, Decreased mobility  Visit Diagnosis: Radiculopathy, cervical region  Muscle weakness (generalized)     Problem List Patient Active Problem List   Diagnosis Date Noted   History of seizures 10/30/2020   Chronic left shoulder pain 10/30/2020   Early menopause 10/09/2012   Amenorrhea 08/10/2012   Seizures (HCC)    Situational anxiety     Iona Beard, DPT 03/21/2021, 2:50 PM  [facility] [address] [place], [place], [address] Phone: [phone]   Fax:  [phone]  Name: Katie Chaney MRN: [id number] Date of Birth: November 08, 1970

## 2021-04-03 ENCOUNTER — Other Ambulatory Visit: Payer: Self-pay

## 2021-04-03 ENCOUNTER — Encounter: Payer: Self-pay | Admitting: Physical Therapy

## 2021-04-03 ENCOUNTER — Ambulatory Visit: Payer: 59 | Admitting: Physical Therapy

## 2021-04-03 DIAGNOSIS — M5412 Radiculopathy, cervical region: Secondary | ICD-10-CM

## 2021-04-03 DIAGNOSIS — M6281 Muscle weakness (generalized): Secondary | ICD-10-CM

## 2021-04-03 NOTE — Therapy (Signed)
[place]. [place], [place], [address] Phone: [phone]   Fax:  [phone]  Physical Therapy Treatment  Patient Details  Name: Katie Chaney MRN: [id number] Date of Birth: 1970-09-17 Referring Provider (PT): Marikay Alar Date: 04/03/2021   PT End of Session - 04/03/21 1702     Visit Number 2    Number of Visits 16    Date for PT Re-Evaluation 05/16/21    PT Start Time 1525    PT Stop Time 1625    PT Time Calculation (min) 60 min    Activity Tolerance Patient tolerated treatment well;Patient limited by pain    Behavior During Therapy [facility] for tasks assessed/performed             Past Medical History:  Diagnosis Date   Amenorrhea 08/10/2012   Anxiety    Chronic chest pain    Depression    Headache(784.0)    Hx of echocardiogram 07/2012   normal   Left sciatic nerve pain    Palpitations 09/2012   30 day monitor with NSR/rare PAC's   Seizures ([place])     Past Surgical History:  Procedure Laterality Date   DILATION AND CURETTAGE OF UTERUS      There were no vitals filed for this visit.   Subjective Assessment - 04/03/21 1528     Subjective Patient asks questions about how to get better and stay better, still with pain and tightness    Currently in Pain? Yes    Pain Score 5     Pain Location Neck    Aggravating Factors  stress                               [facility] Adult PT Treatment/Exercise - 04/03/21 0001       Exercises   Exercises Neck      Neck Exercises: Machines for Strengthening   Nustep level 5 x 5 minutes      Neck Exercises: Theraband   Shoulder Extension 20 reps;Red    Rows 20 reps;Red    Shoulder External Rotation 20 reps;Red    Horizontal ABduction 20 reps;Red      Modalities   Modalities Electrical Stimulation;Moist Heat      Moist Heat Therapy   Number Minutes Moist Heat 10 Minutes    Moist Heat Location Cervical      Electrical  Stimulation   Electrical Stimulation Location cervical    Electrical Stimulation Action IFC    Electrical Stimulation Parameters supine    Electrical Stimulation Goals Pain      Manual Therapy   Manual Therapy Soft tissue mobilization;Manual Traction    Soft tissue mobilization to the upper traps and the cervical area    Manual Traction occipital release and neck stretches              Trigger Point Dry Needling - 04/03/21 0001     Consent Given? Yes    Education Handout Provided Yes    Muscles Treated Head and Neck Upper trapezius    Upper Trapezius Response Twitch reponse elicited;Palpable increased muscle length                   PT Education - 04/03/21 1701     Education Details Gave Tband and had her do the scapular stability    Person(s) Educated Patient    Methods Explanation;Demonstration;Handout  Comprehension Verbalized understanding;Returned demonstration              PT Short Term Goals - 04/03/21 1704       PT SHORT TERM GOAL #1   Title I with HEP    Status On-going               PT Long Term Goals - 03/21/21 1447       PT LONG TERM GOAL #1   Title I with final HEP    Time 8    Period Weeks    Status New    Target Date 05/16/21      PT LONG TERM GOAL #2   Title Patient will demosntrate all cervical rOM of at least 805 normal measurements, with pain </+3/10    Time 8    Period Weeks    Status New    Target Date 05/16/21      PT LONG TERM GOAL #3   Title Patient will be able to work a full day with cervical pain </+3/10    Baseline 8/10    Time 8    Period Days    Status New    Target Date 05/16/21                   Plan - 04/03/21 1703     Clinical Impression Statement Started gym program, added tband scapular stability for HEP, talked about the importance of posture for her job.  Added DN, some STM, A lot of LTR's with the DN    PT Next Visit Plan see how she responds and advance as needed, could try  traction    Consulted and Agree with Plan of Care Patient             Patient will benefit from skilled therapeutic intervention in order to improve the following deficits and impairments:  Decreased range of motion, Increased muscle spasms, Pain, Decreased activity tolerance, Improper body mechanics, Postural dysfunction, Decreased strength, Decreased mobility  Visit Diagnosis: Radiculopathy, cervical region  Muscle weakness (generalized)     Problem List Patient Active Problem List   Diagnosis Date Noted   History of seizures 10/30/2020   Chronic left shoulder pain 10/30/2020   Early menopause 10/09/2012   Amenorrhea 08/10/2012   Seizures ([place])    Situational anxiety     [place] Boast, PT 04/03/2021, 5:04 PM  [place]. [place], [place], [address] Phone: [phone]   Fax:  [phone]  Name: Katie Chaney MRN: [id number] Date of Birth: 1971-02-24

## 2021-04-03 NOTE — Patient Instructions (Signed)

## 2021-04-06 ENCOUNTER — Encounter: Payer: Self-pay | Admitting: Diagnostic Neuroimaging

## 2021-04-06 ENCOUNTER — Ambulatory Visit: Payer: Self-pay | Admitting: Diagnostic Neuroimaging

## 2021-04-10 ENCOUNTER — Ambulatory Visit: Payer: 59 | Admitting: Physical Therapy

## 2021-04-18 ENCOUNTER — Ambulatory Visit: Payer: 59 | Attending: Family Medicine | Admitting: Physical Therapy

## 2021-04-18 ENCOUNTER — Other Ambulatory Visit: Payer: Self-pay

## 2021-04-18 ENCOUNTER — Encounter: Payer: Self-pay | Admitting: Physical Therapy

## 2021-04-18 DIAGNOSIS — M6281 Muscle weakness (generalized): Secondary | ICD-10-CM | POA: Diagnosis present

## 2021-04-18 DIAGNOSIS — M5412 Radiculopathy, cervical region: Secondary | ICD-10-CM | POA: Insufficient documentation

## 2021-04-18 NOTE — Therapy (Signed)
[facility] [address] [place], [place], [address] Phone: [phone]   Fax:  [phone]  Physical Therapy Treatment  Patient Details  Name: Rhiana Morash MRN: [id number] Date of Birth: 1970/10/27 Referring Provider (PT): Cheron Every Date: 04/18/2021   PT End of Session - 04/18/21 0926     Visit Number 3    Number of Visits 23    Date for PT Re-Evaluation 05/16/21    PT Start Time 0755    PT Stop Time 0853    PT Time Calculation (min) 58 min    Activity Tolerance Patient tolerated treatment well;Patient limited by pain    Behavior During Therapy [facility] for tasks assessed/performed             Past Medical History:  Diagnosis Date   Amenorrhea 08/10/2012   Anxiety    Chronic chest pain    Depression    Headache(784.0)    Hx of echocardiogram 07/2012   normal   Left sciatic nerve pain    Palpitations 09/2012   30 day monitor with NSR/rare PAC's   Seizures (HCC)     Past Surgical History:  Procedure Laterality Date   DILATION AND CURETTAGE OF UTERUS      There were no vitals filed for this visit.   Subjective Assessment - 04/18/21 0806     Subjective Patient reports that she thought the last treatment worked well anthat she felt better for a while    Currently in Pain? Yes    Pain Score 4     Pain Location Neck    Pain Descriptors / Indicators Sharp    Pain Relieving Factors the last treatment helped                               [facility] Adult PT Treatment/Exercise - 04/18/21 0001       Neck Exercises: Machines for Strengthening   Nustep level 5 x 5 minutes    Cybex Row 20# 2x10    Lat Pull 20# 2x10    Other Machines for Strengthening straight arm pulls 5# cues for posture and core      Neck Exercises: Theraband   Shoulder External Rotation 20 reps;Red    Horizontal ABduction 20 reps;Red      Neck Exercises: Standing   Other Standing Exercises weighted ball overhead  lift      Moist Heat Therapy   Number Minutes Moist Heat 10 Minutes    Moist Heat Location Cervical      Electrical Stimulation   Electrical Stimulation Location cervical    Electrical Stimulation Action IFC    Electrical Stimulation Parameters supine    Electrical Stimulation Goals Pain      Manual Therapy   Manual Therapy Soft tissue mobilization;Manual Traction    Soft tissue mobilization to the upper traps and the cervical area    Manual Traction occipital release and neck stretches              Trigger Point Dry Needling - 04/18/21 0001     Consent Given? Yes    Upper Trapezius Response Twitch reponse elicited;Palpable increased muscle length                     PT Short Term Goals - 04/18/21 7741       PT SHORT TERM GOAL #1   Title I with HEP  Status Achieved               PT Long Term Goals - 04/18/21 3154       PT LONG TERM GOAL #1   Title I with final HEP    Status On-going      PT LONG TERM GOAL #2   Title Patient will demosntrate all cervical rOM of at least 80% normal measurements, with pain </+3/10    Status On-going      PT LONG TERM GOAL #3   Title Patient will be able to work a full day with cervical pain </+3/10    Status On-going                   Plan - 04/18/21 0927     Clinical Impression Statement Patient reports that she had good relief from the last treatment, I added some gym exercises and continued the STM, DN and the modality, she is very tight and tender in the left upper trap and neck with a significant trigger point    PT Next Visit Plan see how she responds and advance as needed, could try traction    Consulted and Agree with Plan of Care Patient             Patient will benefit from skilled therapeutic intervention in order to improve the following deficits and impairments:  Decreased range of motion, Increased muscle spasms, Pain, Decreased activity tolerance, Improper body mechanics,  Postural dysfunction, Decreased strength, Decreased mobility  Visit Diagnosis: Radiculopathy, cervical region  Muscle weakness (generalized)     Problem List Patient Active Problem List   Diagnosis Date Noted   History of seizures 10/30/2020   Chronic left shoulder pain 10/30/2020   Early menopause 10/09/2012   Amenorrhea 08/10/2012   Seizures (HCC)    Situational anxiety     Jearld Lesch, PT 04/18/2021, 9:29 AM  [facility] [address] [place], [place], [address] Phone: [phone]   Fax:  [phone]  Name: Rini Moffit MRN: [id number] Date of Birth: 1970-10-22

## 2021-04-23 ENCOUNTER — Ambulatory Visit: Payer: 59 | Admitting: Physical Therapy

## 2021-05-01 ENCOUNTER — Ambulatory Visit: Payer: 59 | Admitting: Physical Therapy

## 2021-05-01 ENCOUNTER — Other Ambulatory Visit: Payer: Self-pay

## 2021-05-01 ENCOUNTER — Encounter: Payer: Self-pay | Admitting: Physical Therapy

## 2021-05-01 DIAGNOSIS — M5412 Radiculopathy, cervical region: Secondary | ICD-10-CM

## 2021-05-01 DIAGNOSIS — M6281 Muscle weakness (generalized): Secondary | ICD-10-CM

## 2021-05-01 NOTE — Therapy (Signed)
[place]. [place], [place], [address] Phone: [phone]   Fax:  [phone]  Physical Therapy Treatment  Patient Details  Name: Katie Chaney MRN: [id number] Date of Birth: 08-15-1970 Referring Provider (PT): Marikay Alar Date: 05/01/2021   PT End of Session - 05/01/21 0931     Visit Number 4    Number of Visits 23    Date for PT Re-Evaluation 05/16/21    PT Start Time 0839    PT Stop Time 0939    PT Time Calculation (min) 60 min    Behavior During Therapy [facility] for tasks assessed/performed             Past Medical History:  Diagnosis Date   Amenorrhea 08/10/2012   Anxiety    Chronic chest pain    Depression    Headache(784.0)    Hx of echocardiogram 07/2012   normal   Left sciatic nerve pain    Palpitations 09/2012   30 day monitor with NSR/rare PAC's   Seizures ([place])     Past Surgical History:  Procedure Laterality Date   DILATION AND CURETTAGE OF UTERUS      There were no vitals filed for this visit.   Subjective Assessment - 05/01/21 0845     Subjective I have been doing pretty good, less pain overall    Currently in Pain? Yes    Pain Score 2     Pain Location Neck    Pain Descriptors / Indicators Sore;Tightness    Aggravating Factors  stress    Pain Relieving Factors the treatment helps                               [facility] Adult PT Treatment/Exercise - 05/01/21 0001       Neck Exercises: Machines for Strengthening   UBE (Upper Arm Bike) Level 3 x 6 minutes    Cybex Row 20# 2x10    Lat Pull 20# 2x10    Other Machines for Strengthening straight arm pulls 5# cues for posture and core      Neck Exercises: Theraband   Shoulder External Rotation 20 reps;Red    Horizontal ABduction 20 reps;Red      Neck Exercises: Standing   Other Standing Exercises weighted ball overhead lift      Moist Heat Therapy   Number Minutes Moist Heat 10 Minutes    Moist  Heat Location Cervical      Electrical Stimulation   Electrical Stimulation Location cervical    Electrical Stimulation Action IFC    Electrical Stimulation Parameters supine    Electrical Stimulation Goals Pain      Manual Therapy   Manual Therapy Soft tissue mobilization;Manual Traction    Soft tissue mobilization to the upper traps and the cervical area    Manual Traction occipital release and neck stretches                       PT Short Term Goals - 04/18/21 0131       PT SHORT TERM GOAL #1   Title I with HEP    Status Achieved               PT Long Term Goals - 05/01/21 0934       PT LONG TERM GOAL #1   Title I with final HEP    Status Partially  Met      PT LONG TERM GOAL #2   Title Patient will demosntrate all cervical rOM of at least 80% normal measurements, with pain </+3/10    Status Partially Met      PT LONG TERM GOAL #3   Title Patient will be able to work a full day with cervical pain </+3/10    Status Partially Met                   Plan - 05/01/21 0931     Clinical Impression Statement Patient reports overall that she is doing much better, she reports that she is getting stronger.  She reports that she is thinking of joining a gym and then may pause PT for a little bit .  Still has some spasms and "knots" reports that she has less pain.  Asks good questions about HEP, posture etc...    PT Next Visit Plan may hold for a little bit as she does the HEP    Consulted and Agree with Plan of Care Patient             Patient will benefit from skilled therapeutic intervention in order to improve the following deficits and impairments:  Decreased range of motion, Increased muscle spasms, Pain, Decreased activity tolerance, Improper body mechanics, Postural dysfunction, Decreased strength, Decreased mobility  Visit Diagnosis: Radiculopathy, cervical region  Muscle weakness (generalized)     Problem List Patient Active  Problem List   Diagnosis Date Noted   History of seizures 10/30/2020   Chronic left shoulder pain 10/30/2020   Early menopause 10/09/2012   Amenorrhea 08/10/2012   Seizures ([place])    Situational anxiety     [place] Boast, PT 05/01/2021, 9:36 AM  [place]. [place], [place], [address] Phone: [phone]   Fax:  [phone]  Name: Katie Chaney MRN: [id number] Date of Birth: 06-Jan-1971

## 2021-05-01 NOTE — Patient Instructions (Signed)
Access Code: [facility] URL: [email or web address] Date: 05/01/2021 Prepared by: Stacie Glaze  Exercises Standing Bilateral Low Shoulder Row with Anchored Resistance - 1 x daily - 7 x weekly - 3 sets - 10 reps - 3 hold Shoulder Extension with Resistance - 1 x daily - 7 x weekly - 3 sets - 10 reps - 3 hold Standing Shoulder External Rotation with Resistance - 1 x daily - 7 x weekly - 3 sets - 10 reps - 3 hold Standing Single Arm Elbow Flexion with Resistance - 1 x daily - 7 x weekly - 3 sets - 10 reps - 3 hold Standing Elbow Extension with Self-Anchored Resistance - 1 x daily - 7 x weekly - 3 sets - 10 reps - 3 hold Supine Chest Press with Resistance - 1 x daily - 7 x weekly - 3 sets - 10 reps - 3 hold

## 2021-05-07 ENCOUNTER — Ambulatory Visit: Payer: 59 | Admitting: Physical Therapy

## 2021-05-14 ENCOUNTER — Ambulatory Visit: Payer: 59 | Admitting: Physical Therapy

## 2021-05-21 ENCOUNTER — Ambulatory Visit: Payer: 59 | Admitting: Physical Therapy

## 2021-05-28 ENCOUNTER — Ambulatory Visit: Payer: 59 | Admitting: Physical Therapy

## 2021-05-29 ENCOUNTER — Ambulatory Visit: Payer: 59 | Admitting: Physical Therapy

## 2021-08-14 ENCOUNTER — Encounter: Payer: Self-pay | Admitting: *Deleted

## 2021-08-14 NOTE — Progress Notes (Unsigned)
[facility]  PATIENT: Katie Chaney DOB: Mar 10, 1970  REFERRING CLINICIAN: Verlin Fester, DC HISTORY FROM: self REASON FOR VISIT: face pain, neck pain   HISTORICAL  CHIEF COMPLAINT:  Chief Complaint  Patient presents with   Neck Pain    RM 1 alone Pt is well, has been having facial and neck pain for about 6 yrs. Recently getting worse, also having headaches and muscle spasms     HISTORY OF PRESENT ILLNESS:  The patient presents for evaluation of face and shoulder pain which has been present over the past 6 years, but has been worsening over time. It has been getting more frequent over time. It is described as sharp pain which radiates from her left shoulder up to her neck, left occiput, and chin. Sometimes it will radiate all the way down her left arm and she will get numbness/tingling in her left hand. Left arm feels weak and she has difficulty reaching it over her head. She also reports muscle spasms over her entire body. Recently completed neck PT which helped a little but her pain continues.  She is also having headaches which radiate from her occiput to the front. Headaches are associated with phonophobia, no photophobia or nausea. They will be so severe that she will need to lie down. She follows with Neurology for migraines and has had multiple brain MRIs which have shown nonspecific white matter changes which are mild-moderately advanced for age. These were stable between MRI in 2021 and 2022. MRI brain in 2022 also noted some degenerative disc disease at C3-4. Had a CT C-spine in 2016 which showed multilevel degenerative changes, has not had neck imaging since then.  Prior therapies: Zonisamide 100 mg QHS Celexa Gabapentin 300 mg BID - helped but caused drowsiness Tizanidine 4 mg PRN - lack of efficacy Mobic Neck PT  OTHER MEDICAL CONDITIONS: childhood epilepsy, migraine   REVIEW OF SYSTEMS: Full 14 system review of systems performed and negative  with exception of: neck pain, arm pain  ALLERGIES: No Known Allergies  HOME MEDICATIONS: Outpatient Medications Prior to Visit  Medication Sig Dispense Refill   citalopram (CELEXA) 20 MG tablet Take 20 mg by mouth daily.     dicyclomine (BENTYL) 20 MG tablet Take 1 tablet (20 mg total) by mouth 2 (two) times daily. 20 tablet 0   ibuprofen (ADVIL,MOTRIN) 800 MG tablet Take 1 tablet (800 mg total) every 8 (eight) hours as needed by mouth. 21 tablet 0   meloxicam (MOBIC) 15 MG tablet Take 15 mg by mouth as needed for pain.     Multiple Vitamin (MULTIVITAMIN) tablet Take 1 tablet by mouth daily.     tiZANidine (ZANAFLEX) 4 MG tablet Take 1 tablet (4 mg total) by mouth every 6 (six) hours as needed for muscle spasms. 30 tablet 1   No facility-administered medications prior to visit.    PAST MEDICAL HISTORY: Past Medical History:  Diagnosis Date   Amenorrhea 08/10/2012   Anxiety    Chronic chest pain    Depression    Facial pain    Headache(784.0)    Hx of echocardiogram 07/2012   normal   Left sciatic nerve pain    Palpitations 09/2012   30 day monitor with NSR/rare PAC's   Seizures (HCC)     PAST SURGICAL HISTORY: Past Surgical History:  Procedure Laterality Date   DILATION AND CURETTAGE OF UTERUS      FAMILY HISTORY: Family History  Problem Relation Age of Onset  Heart disease Mother        Enlarged heart   Hypertension Brother    Asthma Son     SOCIAL HISTORY: Social History   Socioeconomic History   Marital status: Married    Spouse name: Not on file   Number of children: 1   Years of education: Not on file   Highest education level: Not on file  Occupational History    Employer: [employer]  Tobacco Use   Smoking status: Never   Smokeless tobacco: Never  Vaping Use   Vaping Use: Never used  Substance and Sexual Activity   Alcohol use: Yes    Comment: Rarely   Drug use: No   Sexual activity: Yes    Birth control/protection:  None  Other Topics Concern   Not on file  Social History Narrative   Lives with husband   Social Determinants of Health   Financial Resource Strain: Not on file  Food Insecurity: Not on file  Transportation Needs: Not on file  Physical Activity: Not on file  Stress: Not on file  Social Connections: Not on file  Intimate Partner Violence: Not on file     PHYSICAL EXAM   GENERAL EXAM/CONSTITUTIONAL: Vitals:  Vitals:   08/15/21 0921  BP: 139/81  Pulse: 82  Weight: 163 lb (73.9 kg)  Height: 5\' 9"  (1.753 m)   Body mass index is 24.07 kg/m. Wt Readings from Last 3 Encounters:  08/15/21 163 lb (73.9 kg)  11/30/20 164 lb (74.4 kg)  10/30/20 163 lb (73.9 kg)   NEUROLOGIC: MENTAL STATUS:  awake, alert, oriented to person, place and time recent and remote memory intact normal attention and concentration  CRANIAL NERVE:  2nd, 3rd, 4th, 6th - pupils equal and reactive to light, visual fields full to confrontation, extraocular muscles intact, no nystagmus 5th - facial sensation symmetric 7th - facial strength symmetric 8th - hearing intact 9th - palate elevates symmetrically, uvula midline 11th - shoulder shrug symmetric 12th - tongue protrusion midline  MOTOR:  normal bulk and tone, full strength in the BUE, BLE  SENSORY:  normal and symmetric to light touch all 4 extremities  COORDINATION:  finger-nose-finger, fine finger movements normal  REFLEXES:  deep tendon reflexes present and symmetric  GAIT/STATION:  normal  +tender to palpation over L>R neck, shoulders, and occiput   DIAGNOSTIC DATA (LABS, IMAGING, TESTING) - I reviewed patient records, labs, notes, testing and imaging myself where available.  Lab Results  Component Value Date   WBC 4.2 02/27/2020   HGB 13.5 02/27/2020   HCT 41.6 02/27/2020   MCV 93.1 02/27/2020   PLT 144 (L) 02/27/2020      Component Value Date/Time   NA 135 02/27/2020 1235   K 3.5 02/27/2020 1235   CL 98 02/27/2020  1235   CO2 25 02/27/2020 1235   GLUCOSE 92 02/27/2020 1235   BUN 14 02/27/2020 1235   CREATININE 0.68 02/27/2020 1235   CALCIUM 8.8 (L) 02/27/2020 1235   PROT 7.8 02/27/2020 1235   ALBUMIN 4.1 02/27/2020 1235   AST 40 02/27/2020 1235   ALT 38 02/27/2020 1235   ALKPHOS 92 02/27/2020 1235   BILITOT 0.5 02/27/2020 1235   GFRNONAA >60 02/27/2020 1235   GFRAA >60 01/19/2017 1355   No results found for: "CHOL", "HDL", "LDLCALC", "LDLDIRECT", "TRIG", "CHOLHDL" No results found for: "HGBA1C" No results found for: "VITAMINB12" Lab Results  Component Value Date   TSH 1.274 08/10/2012    MRI brain  12/22/20: stable nonspecific white matter changes, no acute process    ASSESSMENT AND PLAN  51 y.o. year old female with a history of childhood epilepsy, migraine who presents for evaluation of neck and face pain. Exam with significant tenderness to palpation over L>R occiput, neck, and shoulders with muscle spasms noted on left. Will order MRI C-spine as she does report radicular pain, numbness, and intermittent weakness in the left arm. She did have some relief with gabapentin 300 mg BID previously but it made her drowsy. Will restart gabapentin at bedtime. Does not find tizanidine helpful, will switch to Robaxin and see if this is more effective. She may benefit from Botox for migraines and neck pain in the future if unable to improve pain with oral medications.   1. Cervical radiculopathy   2. Arm numbness       PLAN: -MRI C-spine -Start gabapentin 600 mg QHS -Start Robaxin 500 mg QHS PRN  Orders Placed This Encounter  Procedures   MR CERVICAL SPINE WO CONTRAST    Meds ordered this encounter  Medications   gabapentin (NEURONTIN) 300 MG capsule    Sig: Take 2 capsules (600 mg total) by mouth at bedtime.    Dispense:  60 capsule    Refill:  5   methocarbamol (ROBAXIN) 500 MG tablet    Sig: Take 1 tablet (500 mg total) by mouth at bedtime as needed for muscle spasms.     Dispense:  30 tablet    Refill:  6    Return in about 6 months (around 02/14/2022).    Ocie DoyneJennifer Taivon Haroon, MD 08/15/21 10:02 AM  I spent an average of 35 minutes chart reviewing and counseling the patient, with at least 50% of the time face to face with the patient.   [facility] [address], [address] [place], [place] [id number] [phone]

## 2021-08-15 ENCOUNTER — Encounter: Payer: Self-pay | Admitting: Psychiatry

## 2021-08-15 ENCOUNTER — Ambulatory Visit (INDEPENDENT_AMBULATORY_CARE_PROVIDER_SITE_OTHER): Payer: 59 | Admitting: Psychiatry

## 2021-08-15 VITALS — BP 139/81 | HR 82 | Ht 69.0 in | Wt 163.0 lb

## 2021-08-15 DIAGNOSIS — M5412 Radiculopathy, cervical region: Secondary | ICD-10-CM | POA: Diagnosis not present

## 2021-08-15 DIAGNOSIS — R200 Anesthesia of skin: Secondary | ICD-10-CM | POA: Diagnosis not present

## 2021-08-15 MED ORDER — METHOCARBAMOL 500 MG PO TABS: 500.0000 mg | ORAL_TABLET | Freq: Every evening | ORAL | 6 refills | Status: AC | PRN

## 2021-08-15 MED ORDER — GABAPENTIN 300 MG PO CAPS: 600.0000 mg | ORAL_CAPSULE | Freq: Every day | ORAL | 5 refills | Status: AC

## 2021-11-16 ENCOUNTER — Telehealth: Payer: Self-pay | Admitting: Psychiatry

## 2021-11-16 NOTE — Telephone Encounter (Signed)
Pt requesting referral for MRI be sent [facility], To Dr. Teryl Lucy  [phone], Fax [phone].

## 2021-11-19 NOTE — Telephone Encounter (Signed)
[employer] Josem Kaufmann: [id number] exp. 11/19/21-01/03/22 sent to Raliegh Ip

## 2022-02-19 ENCOUNTER — Encounter: Payer: Self-pay | Admitting: Psychiatry

## 2022-02-19 ENCOUNTER — Ambulatory Visit: Payer: 59 | Admitting: Psychiatry

## 2022-06-25 IMAGING — CT CT ABD-PELV W/ CM
2 of 5 series · 17 of 46 positions shown, 19 images · IV contrast (Omnipaque)
Comparison: None.

CLINICAL DATA: Constipation, abdominal pain, diverticulitis
suspected

EXAM:
CT ABDOMEN AND PELVIS WITH CONTRAST
TECHNIQUE: Multidetector CT imaging of the abdomen and pelvis was performed
using the standard protocol following bolus administration of
intravenous contrast.
CONTRAST:  100mL OMNIPAQUE IOHEXOL 300 MG/ML  SOLN

[Series 2: axial st · axial · 0.75mm/px · z∈[+920,+1240]mm · 14 of 73 slices shown, 16 images]
[im 5/73  soft-tissue]
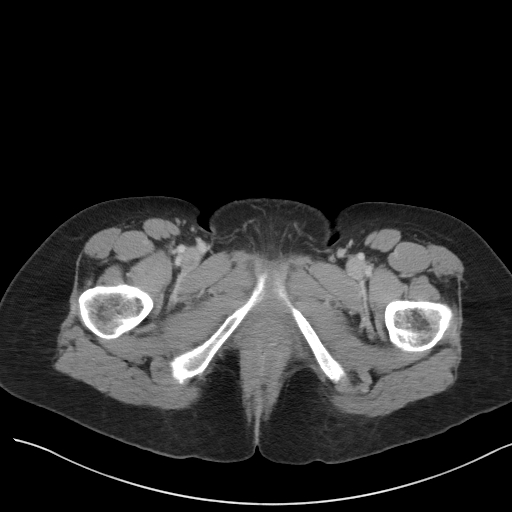
[im 5/73  bone]
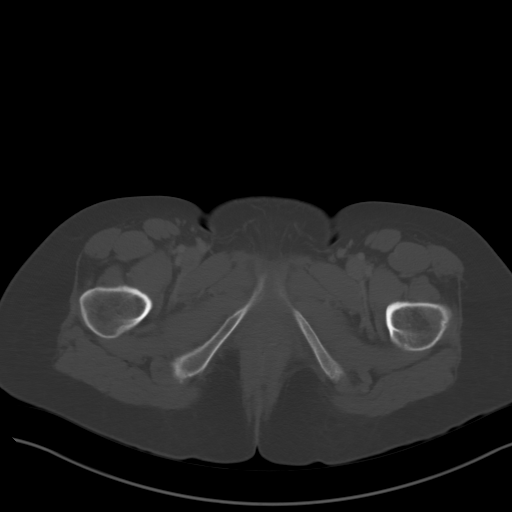
[im 9/73  soft-tissue]
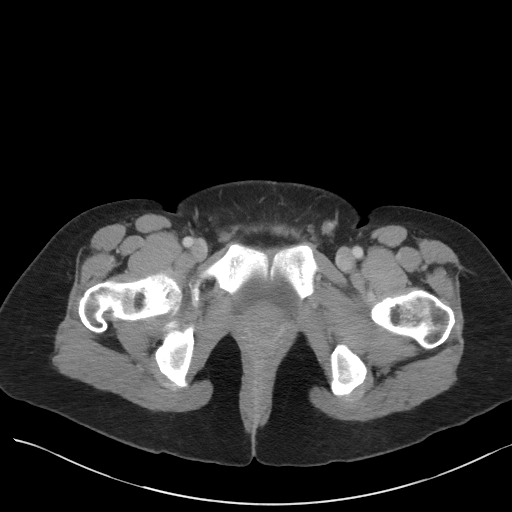
[im 17/73  soft-tissue]
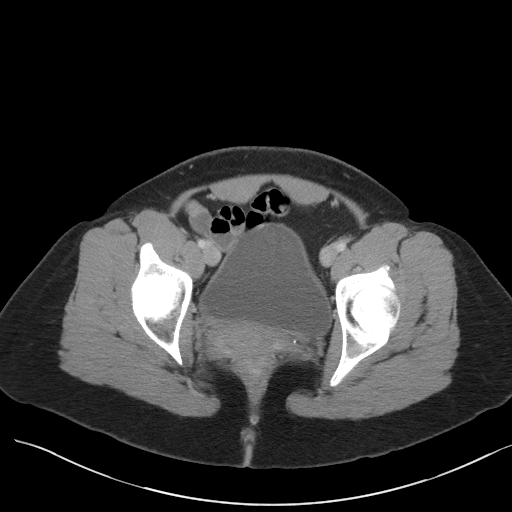
[im 21/73  soft-tissue]
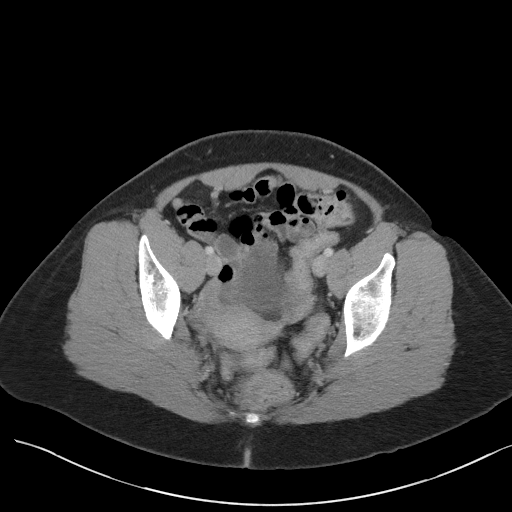
[im 25/73  soft-tissue]
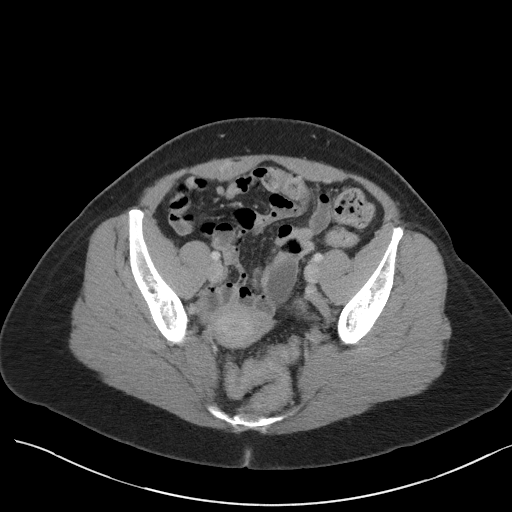
[im 29/73  soft-tissue]
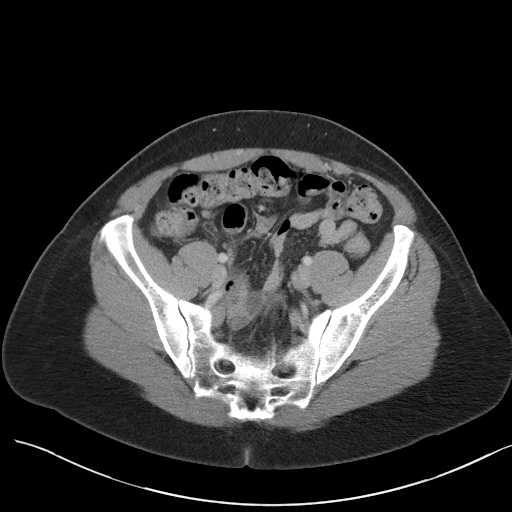
[im 33/73  soft-tissue]
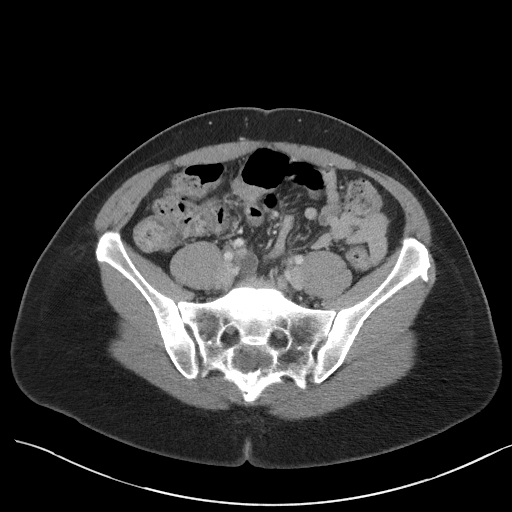
[im 41/73  soft-tissue]
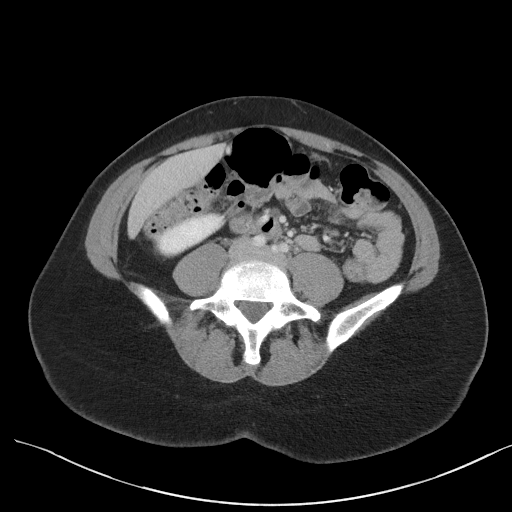
[im 45/73  soft-tissue]
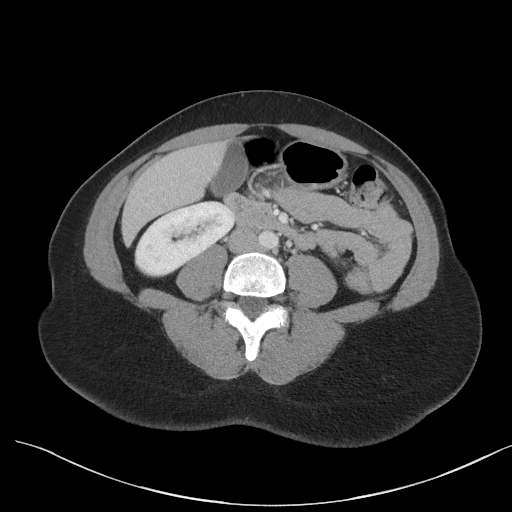
[im 45/73  bone]
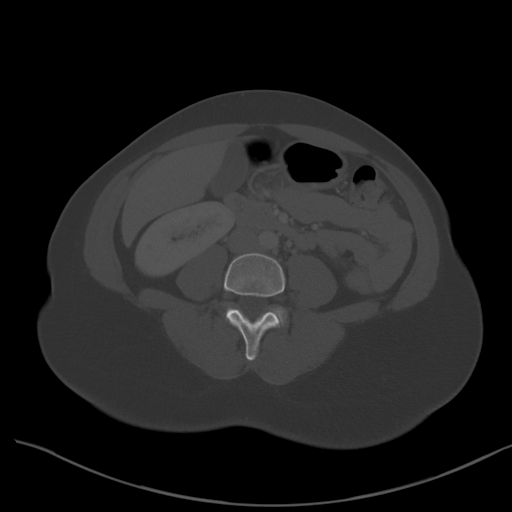
[im 49/73  soft-tissue]
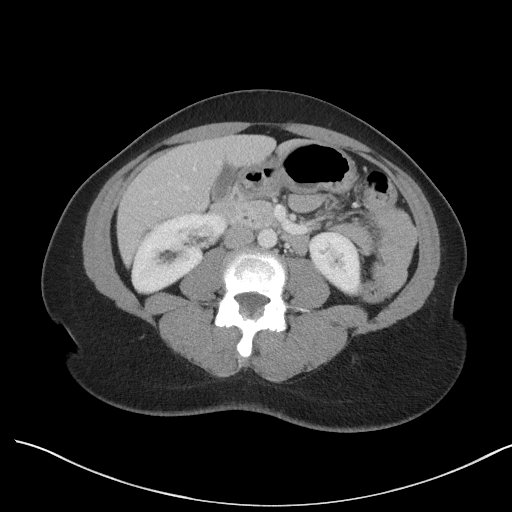
[im 53/73  soft-tissue]
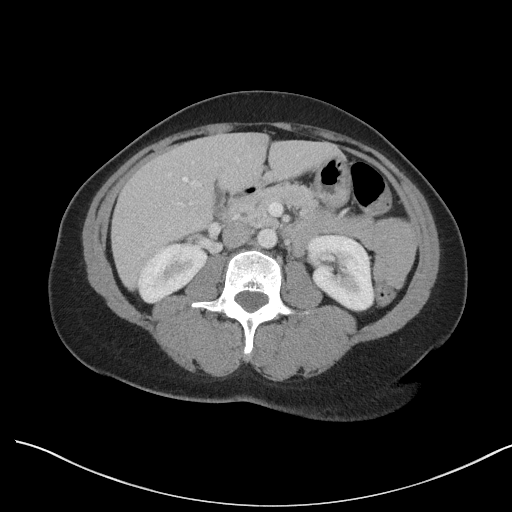
[im 57/73  soft-tissue]
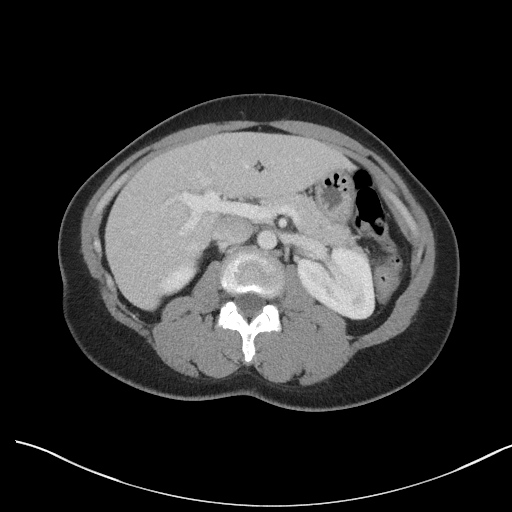
[im 65/73  soft-tissue]
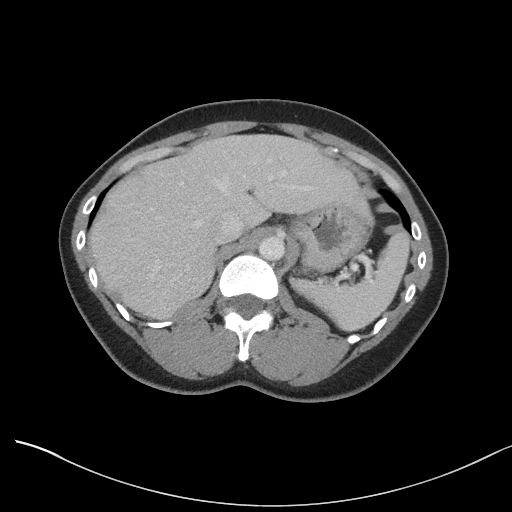
[im 69/73  soft-tissue]
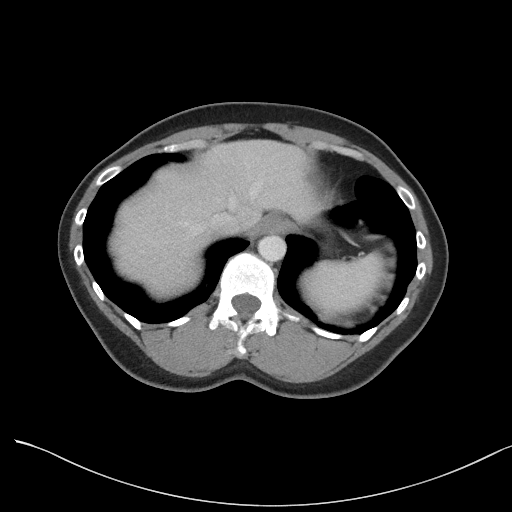

[Series 5: coronal st · coronal · 0.77mm/px · 3 of 100 slices shown]
[im 34/100  soft-tissue]
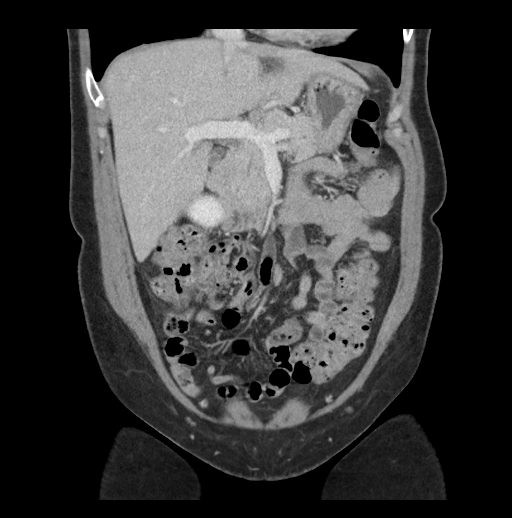
[im 45/100  soft-tissue]
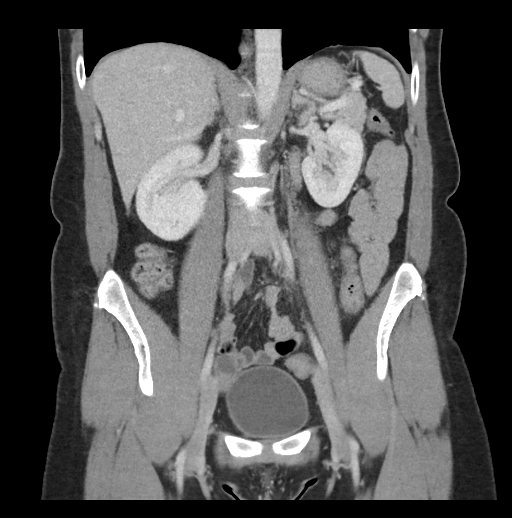
[im 56/100  soft-tissue]
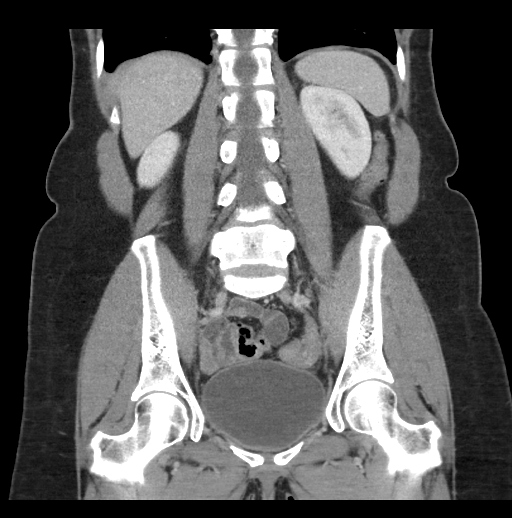

[17 of 46 positions shown; findings below may reference images not displayed]

FINDINGS: Lower chest: No acute abnormality.

Hepatobiliary: No solid liver abnormality is seen. No gallstones,
gallbladder wall thickening, or biliary dilatation.

Pancreas: Unremarkable. No pancreatic ductal dilatation or
surrounding inflammatory changes.

Spleen: Normal in size without significant abnormality.

Adrenals/Urinary Tract: Adrenal glands are unremarkable. Kidneys are
normal, without renal calculi, solid lesion, or hydronephrosis.
Bladder is unremarkable.

Stomach/Bowel: Stomach is within normal limits. Appendix appears
normal. No evidence of bowel wall thickening, distention, or
inflammatory changes.

Vascular/Lymphatic: No significant vascular findings are present. No
enlarged abdominal or pelvic lymph nodes.

Reproductive: No mass or other significant abnormality.

Other: No abdominal wall hernia or abnormality. No abdominopelvic
ascites.

Musculoskeletal: No acute or significant osseous findings.
IMPRESSION: 1.  No acute CT findings of the abdomen or pelvis to explain pain.

2.  No significant diverticular disease.

3.  No large burden of stool in the colon.

## 2022-07-26 ENCOUNTER — Other Ambulatory Visit: Payer: Self-pay | Admitting: Physician Assistant

## 2022-07-26 DIAGNOSIS — Z1231 Encounter for screening mammogram for malignant neoplasm of breast: Secondary | ICD-10-CM
